# Patient Record
Sex: Female | Born: 1945 | Race: White | Hispanic: No | Marital: Single | Smoking: Never smoker
Health system: Southern US, Community
[De-identification: ages and names within clinical notes are randomized; demographics above are authoritative.]

## PROBLEM LIST (undated history)

## (undated) DIAGNOSIS — Z789 Other specified health status: Secondary | ICD-10-CM

## (undated) HISTORY — PX: HIP SURGERY: SHX245

---

## 2006-02-22 HISTORY — PX: INTRACAPSULAR CATARACT EXTRACTION: SHX361

## 2006-03-23 ENCOUNTER — Ambulatory Visit: Payer: Self-pay | Admitting: Ophthalmology

## 2014-05-27 DIAGNOSIS — H40003 Preglaucoma, unspecified, bilateral: Secondary | ICD-10-CM | POA: Diagnosis not present

## 2014-06-03 DIAGNOSIS — H40003 Preglaucoma, unspecified, bilateral: Secondary | ICD-10-CM | POA: Diagnosis not present

## 2014-12-02 DIAGNOSIS — H40003 Preglaucoma, unspecified, bilateral: Secondary | ICD-10-CM | POA: Diagnosis not present

## 2015-06-03 DIAGNOSIS — H40003 Preglaucoma, unspecified, bilateral: Secondary | ICD-10-CM | POA: Diagnosis not present

## 2015-12-02 DIAGNOSIS — H40003 Preglaucoma, unspecified, bilateral: Secondary | ICD-10-CM | POA: Diagnosis not present

## 2015-12-24 DIAGNOSIS — Z01419 Encounter for gynecological examination (general) (routine) without abnormal findings: Secondary | ICD-10-CM | POA: Diagnosis not present

## 2015-12-25 ENCOUNTER — Other Ambulatory Visit: Payer: Self-pay | Admitting: Obstetrics & Gynecology

## 2015-12-25 DIAGNOSIS — Z1231 Encounter for screening mammogram for malignant neoplasm of breast: Secondary | ICD-10-CM

## 2015-12-25 LAB — HM PAP SMEAR: HM PAP: NEGATIVE

## 2016-01-05 DIAGNOSIS — Z1211 Encounter for screening for malignant neoplasm of colon: Secondary | ICD-10-CM | POA: Diagnosis not present

## 2016-01-28 ENCOUNTER — Ambulatory Visit
Admission: RE | Admit: 2016-01-28 | Discharge: 2016-01-28 | Disposition: A | Discharge status: Home / Self Care | Payer: Medicare Other | Source: Ambulatory Visit | Attending: Obstetrics & Gynecology | Admitting: Obstetrics & Gynecology

## 2016-01-28 DIAGNOSIS — Z1231 Encounter for screening mammogram for malignant neoplasm of breast: Secondary | ICD-10-CM | POA: Diagnosis not present

## 2016-01-28 LAB — HM MAMMOGRAPHY

## 2016-02-05 ENCOUNTER — Inpatient Hospital Stay
Admission: RE | Admit: 2016-02-05 | Discharge: 2016-02-05 | Disposition: A | Discharge status: Home / Self Care | Payer: Self-pay | Source: Ambulatory Visit | Attending: *Deleted | Admitting: *Deleted

## 2016-02-05 ENCOUNTER — Other Ambulatory Visit: Payer: Self-pay | Admitting: *Deleted

## 2016-02-05 ENCOUNTER — Other Ambulatory Visit: Payer: Self-pay

## 2016-02-05 DIAGNOSIS — Z9289 Personal history of other medical treatment: Secondary | ICD-10-CM

## 2016-05-31 DIAGNOSIS — H40003 Preglaucoma, unspecified, bilateral: Secondary | ICD-10-CM | POA: Diagnosis not present

## 2016-12-07 DIAGNOSIS — H40053 Ocular hypertension, bilateral: Secondary | ICD-10-CM | POA: Diagnosis not present

## 2017-01-11 ENCOUNTER — Other Ambulatory Visit: Payer: Self-pay | Admitting: Obstetrics & Gynecology

## 2017-01-25 ENCOUNTER — Encounter: Payer: Self-pay | Admitting: Obstetrics & Gynecology

## 2017-01-25 ENCOUNTER — Ambulatory Visit (INDEPENDENT_AMBULATORY_CARE_PROVIDER_SITE_OTHER): Payer: Medicare Other | Admitting: Obstetrics & Gynecology

## 2017-01-25 VITALS — BP 130/80 | HR 88 | Ht 64.0 in | Wt 126.0 lb

## 2017-01-25 DIAGNOSIS — N959 Unspecified menopausal and perimenopausal disorder: Secondary | ICD-10-CM

## 2017-01-25 DIAGNOSIS — Z1231 Encounter for screening mammogram for malignant neoplasm of breast: Secondary | ICD-10-CM

## 2017-01-25 DIAGNOSIS — Z1211 Encounter for screening for malignant neoplasm of colon: Secondary | ICD-10-CM

## 2017-01-25 DIAGNOSIS — N952 Postmenopausal atrophic vaginitis: Secondary | ICD-10-CM

## 2017-01-25 DIAGNOSIS — Z1239 Encounter for other screening for malignant neoplasm of breast: Secondary | ICD-10-CM

## 2017-01-25 MED ORDER — CONJ ESTROG-MEDROXYPROGEST ACE 0.3-1.5 MG PO TABS: 1.0000 | ORAL_TABLET | Freq: Every day | ORAL | 11 refills | Status: DC

## 2017-01-25 MED ORDER — ACYCLOVIR 400 MG PO TABS: 400.0000 mg | ORAL_TABLET | Freq: Two times a day (BID) | ORAL | 11 refills | Status: DC

## 2017-01-25 MED ORDER — CONJ ESTROG-MEDROXYPROGEST ACE 0.3-1.5 MG PO TABS: 1.0000 | ORAL_TABLET | Freq: Every day | ORAL | 4 refills | Status: DC

## 2017-01-25 NOTE — Progress Notes (Signed)
HPI:      [name] is a 71 y.o. [phone] who is postmenopausal, presents today for a problem visit.  She complains of vaginal dryness.   Symptoms have been present for several years. Symptoms are mod to severe and has led her to come in today to seek options for intervention.  She has been on Prempro in past and this helps.  Wants to restart/cont this tx plan.  Does not desire vag ERT or alternative tx's. She is also taking acyclovir PRN for cold sore sx's. Denies PostMenopausal Bleeding.  No breast mass.  No hot flash.  Feels different mood and fatigue off of HRT.  PMHx: She  has no past medical history on file. Also,  has a past surgical history that includes Intracapsular cataract extraction., family history is not on file.,  reports that  has never smoked. she has never used smokeless tobacco. She reports that she drinks alcohol. She reports that she does not use drugs.  Current Outpatient Medications:  .  acyclovir (ZOVIRAX) 400 MG tablet, Take 1 tablet (400 mg total) by mouth 2 (two) times daily. Or as needed for symptoms., Disp: 60 tablet, Rfl: 11 .  estrogen, conjugated,-medroxyprogesterone (PREMPRO) 0.3-1.5 MG tablet, Take 1 tablet by mouth daily., Disp: 90 tablet, Rfl: 4 Also, has no allergies on file.  Review of Systems  Constitutional: Negative for chills, fever and malaise/fatigue.  HENT: Negative for congestion, sinus pain and sore throat.   Eyes: Negative for blurred vision and pain.  Respiratory: Negative for cough and wheezing.   Cardiovascular: Negative for chest pain and leg swelling.  Gastrointestinal: Negative for abdominal pain, constipation, diarrhea, heartburn, nausea and vomiting.  Genitourinary: Negative for dysuria, frequency, hematuria and urgency.  Musculoskeletal: Negative for back pain, joint pain, myalgias and neck pain.  Skin: Negative for itching and rash.  Neurological: Negative for dizziness, tremors and weakness.  Endo/Heme/Allergies: Does not  bruise/bleed easily.  Psychiatric/Behavioral: Negative for depression. The patient is not nervous/anxious and does not have insomnia.    Objective: BP 130/80   Pulse 88   Ht 5\' 4"  (1.626 m)   Wt 126 lb (57.2 kg)   BMI 21.63 kg/m  Physical Exam  Constitutional: She is oriented to person, place, and time. She appears well-developed and well-nourished. No distress.  Genitourinary: Rectum normal, vagina normal and uterus normal. Pelvic exam was performed with patient supine. There is no rash or lesion on the right labia. There is no rash or lesion on the left labia. Vagina exhibits no lesion. No bleeding in the vagina. Right adnexum does not display mass and does not display tenderness. Left adnexum does not display mass and does not display tenderness. Cervix does not exhibit motion tenderness, lesion, friability or polyp.   Uterus is mobile and midaxial. Uterus is not enlarged or exhibiting a mass.  HENT:  Head: Normocephalic and atraumatic. Head is without laceration.  Right Ear: Hearing normal.  Left Ear: Hearing normal.  Nose: No epistaxis.  No foreign bodies.  Mouth/Throat: Uvula is midline, oropharynx is clear and moist and mucous membranes are normal.  Eyes: Pupils are equal, round, and reactive to light.  Neck: Normal range of motion. Neck supple. No thyromegaly present.  Cardiovascular: Normal rate and regular rhythm. Exam reveals no gallop and no friction rub.  No murmur heard. Pulmonary/Chest: Effort normal and breath sounds normal. No respiratory distress. She has no wheezes. Right breast exhibits no mass, no skin change and no tenderness. Left breast exhibits no  mass, no skin change and no tenderness.  Abdominal: Soft. Bowel sounds are normal. She exhibits no distension. There is no tenderness. There is no rebound.  Musculoskeletal: Normal range of motion.  Neurological: She is alert and oriented to person, place, and time. No cranial nerve deficit.  Skin: Skin is warm and dry.   Psychiatric: She has a normal mood and affect. Judgment normal.  Vitals reviewed.   ASSESSMENT/PLAN:  Menopause. 1. Vaginal atrophy Prempro, pros and cons discussed; eRx. HRT I have discussed HRT with the patient in detail.  The risk/benefits of it were reviewed.  She understands that during menopause Estrogen decreases dramatically and that this results in an increased risk of cardiovascular disease as well as osteoporosis.  We have also discussed the fact that hot flashes often result from a decrease in Estrogen, and that by replacing Estrogen, they can often be alleviated.  We have discussed skin, vaginal and urinary tract changes that may also take place from this drop in Estrogen.  Emotional changes have also been linked to Estrogen and we have briefly discussed this.  The benefits of HRT including decrease in hot flashes, vaginal dryness, and osteoporosis were discussed.  The emotional benefit and a possible change in her cardiovascular risk profile was also reviewed.  The risks associated with Hormone Replacement Therapy were also reviewed.  The use of unopposed Estrogen and its relationship to endometrial cancer was discussed.  The addition of Progesterone and its beneficial effect on endometrial cancer was also noted.  The fact that there has been no consistent definitive studies showing an increase in breast cancer in women who use HRT was discussed with the patient.  The possible side effects including breast tenderness, fluid retention, mood changes and vaginal bleeding were discussed.  The patient was informed that this is an elective medication and that she may choose not to take Hormone Replacement Therapy.  Literature on HRT was given, and I believe that after answering all of the patient's questions, she has an adequate and informed understanding of HRT.  Special emphasis on the WHI study, as well as several studies since that pertaining to the risks and benefits of estrogen replacement  therapy were compared.  The possible limitations of these studies were discussed including the age stratification of the WHI study.  The possible role of Progesterone in these studies was discussed in detail.  I believe that the patient has an informed knowledge of the risks and benefits of HRT.  I have specifically discussed WHI findings and current updates.  Different type of hormone formulation and methods of taking hormone replacement therapy discussed.   2. Menopausal disorder As above  3. Screen for colon cancer - Fecal occult blood, imunochemical - Cologuard in past; due again 2020  4. Screening for breast cancer - MM DIGITAL SCREENING BILATERAL; Future  5. Annual every other year (next year) Pap last year normal  Barnett Applebaum, MD, [facility] 01/25/2017  9:46 AM

## 2017-01-25 NOTE — Patient Instructions (Signed)
PAP every three years Mammogram every year    Call [phone] to schedule at [facility] yearly (with PCP)   

## 2017-01-27 DIAGNOSIS — Z1211 Encounter for screening for malignant neoplasm of colon: Secondary | ICD-10-CM | POA: Diagnosis not present

## 2017-02-04 ENCOUNTER — Ambulatory Visit
Admission: RE | Admit: 2017-02-04 | Discharge: 2017-02-04 | Disposition: A | Discharge status: Home / Self Care | Payer: Medicare Other | Source: Ambulatory Visit | Attending: Obstetrics & Gynecology | Admitting: Obstetrics & Gynecology

## 2017-02-04 DIAGNOSIS — Z1231 Encounter for screening mammogram for malignant neoplasm of breast: Secondary | ICD-10-CM | POA: Insufficient documentation

## 2017-02-04 DIAGNOSIS — Z1239 Encounter for other screening for malignant neoplasm of breast: Secondary | ICD-10-CM

## 2017-02-04 LAB — FECAL OCCULT BLOOD, IMMUNOCHEMICAL: Fecal Occult Bld: NEGATIVE

## 2017-02-06 ENCOUNTER — Encounter: Payer: Self-pay | Admitting: Obstetrics & Gynecology

## 2017-06-06 DIAGNOSIS — H40053 Ocular hypertension, bilateral: Secondary | ICD-10-CM | POA: Diagnosis not present

## 2017-08-29 DIAGNOSIS — H2512 Age-related nuclear cataract, left eye: Secondary | ICD-10-CM | POA: Diagnosis not present

## 2017-08-30 NOTE — Discharge Instructions (Signed)

## 2017-08-31 ENCOUNTER — Encounter: Payer: Self-pay | Admitting: *Deleted

## 2017-08-31 ENCOUNTER — Other Ambulatory Visit: Payer: Self-pay

## 2017-09-07 ENCOUNTER — Ambulatory Visit: Payer: Medicare Other | Admitting: Anesthesiology

## 2017-09-07 ENCOUNTER — Encounter
Admission: RE | Disposition: A | Discharge status: Home / Self Care | Payer: Self-pay | Source: Ambulatory Visit | Attending: Ophthalmology

## 2017-09-07 ENCOUNTER — Ambulatory Visit
Admission: RE | Admit: 2017-09-07 | Discharge: 2017-09-07 | Disposition: A | Discharge status: Home / Self Care | Payer: Medicare Other | Source: Ambulatory Visit | Attending: Ophthalmology | Admitting: Ophthalmology

## 2017-09-07 DIAGNOSIS — H25812 Combined forms of age-related cataract, left eye: Secondary | ICD-10-CM | POA: Diagnosis not present

## 2017-09-07 DIAGNOSIS — Z881 Allergy status to other antibiotic agents status: Secondary | ICD-10-CM | POA: Diagnosis not present

## 2017-09-07 DIAGNOSIS — Z882 Allergy status to sulfonamides status: Secondary | ICD-10-CM | POA: Diagnosis not present

## 2017-09-07 DIAGNOSIS — Z79818 Long term (current) use of other agents affecting estrogen receptors and estrogen levels: Secondary | ICD-10-CM | POA: Diagnosis not present

## 2017-09-07 DIAGNOSIS — Z9849 Cataract extraction status, unspecified eye: Secondary | ICD-10-CM | POA: Diagnosis not present

## 2017-09-07 DIAGNOSIS — H2512 Age-related nuclear cataract, left eye: Secondary | ICD-10-CM | POA: Insufficient documentation

## 2017-09-07 DIAGNOSIS — Z888 Allergy status to other drugs, medicaments and biological substances status: Secondary | ICD-10-CM | POA: Insufficient documentation

## 2017-09-07 HISTORY — PX: CATARACT EXTRACTION W/PHACO: SHX586

## 2017-09-07 HISTORY — DX: Other specified health status: Z78.9

## 2017-09-07 SURGERY — PHACOEMULSIFICATION, CATARACT, WITH IOL INSERTION
Anesthesia: Monitor Anesthesia Care | Site: Eye | Laterality: Left | Wound class: "Clean "

## 2017-09-07 MED ORDER — ONDANSETRON HCL 4 MG/2ML IJ SOLN: 4.0000 mg | Freq: Once | INTRAMUSCULAR | Status: DC | PRN

## 2017-09-07 MED ORDER — MOXIFLOXACIN HCL 0.5 % OP SOLN
1.0000 [drp] | OPHTHALMIC | Status: DC | PRN
  Administered 2017-09-07 (×3): 1 [drp] via OPHTHALMIC

## 2017-09-07 MED ORDER — MOXIFLOXACIN HCL 0.5 % OP SOLN
OPHTHALMIC | Status: DC | PRN
  Administered 2017-09-07: 0.2 mL via OPHTHALMIC

## 2017-09-07 MED ORDER — LIDOCAINE HCL (PF) 2 % IJ SOLN
INTRAOCULAR | Status: DC | PRN
  Administered 2017-09-07: 1 mL

## 2017-09-07 MED ORDER — PHENYLEPHRINE HCL 10 % OP SOLN
1.0000 [drp] | OPHTHALMIC | Status: DC | PRN
  Administered 2017-09-07 (×3): 1 [drp] via OPHTHALMIC

## 2017-09-07 MED ORDER — CYCLOPENTOLATE HCL 2 % OP SOLN
1.0000 [drp] | OPHTHALMIC | Status: DC | PRN
  Administered 2017-09-07 (×3): 1 [drp] via OPHTHALMIC

## 2017-09-07 MED ORDER — MIDAZOLAM HCL 2 MG/2ML IJ SOLN
INTRAMUSCULAR | Status: DC | PRN
  Administered 2017-09-07 (×2): 1 mg via INTRAVENOUS

## 2017-09-07 MED ORDER — TETRACAINE HCL 0.5 % OP SOLN
1.0000 [drp] | OPHTHALMIC | Status: DC | PRN
  Administered 2017-09-07 (×2): 1 [drp] via OPHTHALMIC

## 2017-09-07 MED ORDER — EPINEPHRINE PF 1 MG/ML IJ SOLN
INTRAOCULAR | Status: DC | PRN
  Administered 2017-09-07: 70 mL via OPHTHALMIC

## 2017-09-07 MED ORDER — LACTATED RINGERS IV SOLN: 10.0000 mL/h | INTRAVENOUS | Status: DC

## 2017-09-07 MED ORDER — NA HYALUR & NA CHOND-NA HYALUR 0.4-0.35 ML IO KIT
PACK | INTRAOCULAR | Status: DC | PRN
  Administered 2017-09-07: 1 mL via INTRAOCULAR

## 2017-09-07 MED ORDER — OXYCODONE HCL 5 MG/5ML PO SOLN: 5.0000 mg | Freq: Once | ORAL | Status: DC | PRN

## 2017-09-07 MED ORDER — FENTANYL CITRATE (PF) 100 MCG/2ML IJ SOLN
INTRAMUSCULAR | Status: DC | PRN
  Administered 2017-09-07: 50 ug via INTRAVENOUS

## 2017-09-07 MED ORDER — OXYCODONE HCL 5 MG PO TABS: 5.0000 mg | ORAL_TABLET | Freq: Once | ORAL | Status: DC | PRN

## 2017-09-07 MED ORDER — BRIMONIDINE TARTRATE-TIMOLOL 0.2-0.5 % OP SOLN
OPHTHALMIC | Status: DC | PRN
  Administered 2017-09-07: 1 [drp] via OPHTHALMIC

## 2017-09-07 SURGICAL SUPPLY — 21 items
CANNULA ANT/CHMB 27G (MISCELLANEOUS) ×1 IMPLANT
CANNULA ANT/CHMB 27GA (MISCELLANEOUS) ×2 IMPLANT
GLOVE SURG LX 7.5 STRW (GLOVE) ×1
GLOVE SURG LX STRL 7.5 STRW (GLOVE) ×1 IMPLANT
GLOVE SURG TRIUMPH 8.0 PF LTX (GLOVE) ×2 IMPLANT
GOWN STRL REUS W/ TWL LRG LVL3 (GOWN DISPOSABLE) ×2 IMPLANT
GOWN STRL REUS W/TWL LRG LVL3 (GOWN DISPOSABLE) ×2
LENS IOL ACRSF IQ ULTRA 16.5 (Intraocular Lens) IMPLANT
LENS IOL ACRYSOF IQ 16.5 (Intraocular Lens) ×2 IMPLANT
MARKER SKIN DUAL TIP RULER LAB (MISCELLANEOUS) ×2 IMPLANT
NDL FILTER BLUNT 18X1 1/2 (NEEDLE) ×1 IMPLANT
NEEDLE FILTER BLUNT 18X 1/2SAF (NEEDLE) ×1
NEEDLE FILTER BLUNT 18X1 1/2 (NEEDLE) ×1 IMPLANT
PACK CATARACT BRASINGTON (MISCELLANEOUS) ×2 IMPLANT
PACK EYE AFTER SURG (MISCELLANEOUS) ×2 IMPLANT
PACK OPTHALMIC (MISCELLANEOUS) ×2 IMPLANT
SYR 3ML LL SCALE MARK (SYRINGE) ×2 IMPLANT
SYR 5ML LL (SYRINGE) ×2 IMPLANT
SYR TB 1ML LUER SLIP (SYRINGE) ×2 IMPLANT
WATER STERILE IRR 500ML POUR (IV SOLUTION) ×2 IMPLANT
WIPE NON LINTING 3.25X3.25 (MISCELLANEOUS) ×2 IMPLANT

## 2017-09-07 NOTE — Anesthesia Procedure Notes (Signed)
Procedure Name: MAC Date/Time: 09/07/2017 9:51 AM Performed by: Lind Guest, CRNA Pre-anesthesia Checklist: Patient identified, Emergency Drugs available, Suction available, Patient being monitored and Timeout performed Patient Re-evaluated:Patient Re-evaluated prior to induction Oxygen Delivery Method: Nasal cannula

## 2017-09-07 NOTE — Anesthesia Preprocedure Evaluation (Signed)
Anesthesia Evaluation  Patient identified by MRN, date of birth, ID band Patient awake    Reviewed: Allergy & Precautions, H&P , NPO status , Patient's Chart, lab work & pertinent test results  Airway Mallampati: II   Neck ROM: full    Dental no notable dental hx.    Pulmonary neg pulmonary ROS,    Pulmonary exam normal breath sounds clear to auscultation       Cardiovascular negative cardio ROS Normal cardiovascular exam Rhythm:regular Rate:Normal     Neuro/Psych    GI/Hepatic negative GI ROS, Neg liver ROS,   Endo/Other  negative endocrine ROS  Renal/GU negative Renal ROS     Musculoskeletal   Abdominal   Peds  Hematology negative hematology ROS (+)   Anesthesia Other Findings   Reproductive/Obstetrics negative OB ROS                            Anesthesia Physical Anesthesia Plan  ASA: II  Anesthesia Plan: MAC   Post-op Pain Management:    Induction:   PONV Risk Score and Plan:   Airway Management Planned:   Additional Equipment:   Intra-op Plan:   Post-operative Plan:   Informed Consent: I have reviewed the patients History and Physical, chart, labs and discussed the procedure including the risks, benefits and alternatives for the proposed anesthesia with the patient or authorized representative who has indicated his/her understanding and acceptance.     Plan Discussed with:   Anesthesia Plan Comments:         Anesthesia Quick Evaluation

## 2017-09-07 NOTE — H&P (Signed)
The History and Physical notes are on paper, have been signed, and are to be scanned. The patient remains stable and unchanged from the H&P.   Previous H&P reviewed, patient examined, and there are no changes.  Leah Gordon 09/07/2017 9:23 AM

## 2017-09-07 NOTE — Anesthesia Postprocedure Evaluation (Signed)
Anesthesia Post Note  Patient: Leah Gordon  Procedure(s) Performed: CATARACT EXTRACTION PHACO AND INTRAOCULAR LENS PLACEMENT ([facility]) LEFT (Left Eye)  Patient location during evaluation: PACU Anesthesia Type: MAC Level of consciousness: awake and alert Pain management: pain level controlled Vital Signs Assessment: post-procedure vital signs reviewed and stable Respiratory status: spontaneous breathing Cardiovascular status: blood pressure returned to baseline Postop Assessment: no headache Anesthetic complications: no    Jaci Standard, III,  Verdell Kincannon D

## 2017-09-07 NOTE — Transfer of Care (Signed)
Immediate Anesthesia Transfer of Care Note  Patient: Leah Gordon  Procedure(s) Performed: CATARACT EXTRACTION PHACO AND INTRAOCULAR LENS PLACEMENT ([facility]) LEFT (Left Eye)  Patient Location: PACU  Anesthesia Type: MAC  Level of Consciousness: awake, alert  and patient cooperative  Airway and Oxygen Therapy: Patient Spontanous Breathing and Patient connected to supplemental oxygen  Post-op Assessment: Post-op Vital signs reviewed, Patient's Cardiovascular Status Stable, Respiratory Function Stable, Patent Airway and No signs of Nausea or vomiting  Post-op Vital Signs: Reviewed and stable  Complications: No apparent anesthesia complications

## 2017-09-07 NOTE — Op Note (Signed)
OPERATIVE NOTE  Leah Gordon [id number] 09/07/2017   PREOPERATIVE DIAGNOSIS:  Nuclear sclerotic cataract left eye. H25.12   POSTOPERATIVE DIAGNOSIS:    Nuclear sclerotic cataract left eye.     PROCEDURE:  Phacoemusification with posterior chamber intraocular lens placement of the left eye   LENS:   Implant Name Type Inv. Item Serial No. Manufacturer Lot No. LRB No. Used  LENS IOL ACRYSOF IQ 16.5 - [id number] Intraocular Lens LENS IOL ACRYSOF IQ 16.5 [id number] [employer]  Left 1        ULTRASOUND TIME: 19  % of 1 minutes 19 seconds, CDE 15.4  SURGEON:  Wyonia Hough, MD   ANESTHESIA:  Topical with tetracaine drops and 2% Xylocaine jelly, augmented with 1% preservative-free intracameral lidocaine.    COMPLICATIONS:  None.   DESCRIPTION OF PROCEDURE:  The patient was identified in the holding room and transported to the operating room and placed in the supine position under the operating microscope.  The left eye was identified as the operative eye and it was prepped and draped in the usual sterile ophthalmic fashion.   A 1 millimeter clear-corneal paracentesis was made at the 1:30 position.  0.5 ml of preservative-free 1% lidocaine was injected into the anterior chamber.  The anterior chamber was filled with Viscoat viscoelastic.  A 2.4 millimeter keratome was used to make a near-clear corneal incision at the 10:30 position.  .  A curvilinear capsulorrhexis was made with a cystotome and capsulorrhexis forceps.  Balanced salt solution was used to hydrodissect and hydrodelineate the nucleus.   Phacoemulsification was then used in stop and chop fashion to remove the lens nucleus and epinucleus.  The remaining cortex was then removed using the irrigation and aspiration handpiece. Provisc was then placed into the capsular bag to distend it for lens placement.  A lens was then injected into the capsular bag.  The remaining viscoelastic was aspirated.   Wounds were hydrated with  balanced salt solution.  The anterior chamber was inflated to a physiologic pressure with balanced salt solution.  No wound leaks were noted. Vigamox 0.2 ml of a 1mg  per ml solution was injected into the anterior chamber for a dose of 0.2 mg of intracameral antibiotic at the completion of the case.   Timolol and Brimonidine drops were applied to the eye.  The patient was taken to the recovery room in stable condition without complications of anesthesia or surgery.  Leah Gordon 09/07/2017, 10:06 AM

## 2017-09-08 ENCOUNTER — Encounter: Payer: Self-pay | Admitting: Ophthalmology

## 2017-12-23 DIAGNOSIS — H40053 Ocular hypertension, bilateral: Secondary | ICD-10-CM | POA: Diagnosis not present

## 2018-03-02 ENCOUNTER — Ambulatory Visit (INDEPENDENT_AMBULATORY_CARE_PROVIDER_SITE_OTHER): Payer: Medicare Other | Admitting: Obstetrics & Gynecology

## 2018-03-02 ENCOUNTER — Encounter: Payer: Self-pay | Admitting: Obstetrics & Gynecology

## 2018-03-02 ENCOUNTER — Other Ambulatory Visit (HOSPITAL_COMMUNITY)
Admission: RE | Admit: 2018-03-02 | Discharge: 2018-03-02 | Disposition: A | Discharge status: Home / Self Care | Payer: Medicare Other | Source: Ambulatory Visit | Attending: Obstetrics & Gynecology | Admitting: Obstetrics & Gynecology

## 2018-03-02 VITALS — BP 120/80 | Ht 64.0 in | Wt 128.0 lb

## 2018-03-02 DIAGNOSIS — Z01419 Encounter for gynecological examination (general) (routine) without abnormal findings: Secondary | ICD-10-CM

## 2018-03-02 DIAGNOSIS — Z1239 Encounter for other screening for malignant neoplasm of breast: Secondary | ICD-10-CM

## 2018-03-02 DIAGNOSIS — Z124 Encounter for screening for malignant neoplasm of cervix: Secondary | ICD-10-CM

## 2018-03-02 DIAGNOSIS — Z1211 Encounter for screening for malignant neoplasm of colon: Secondary | ICD-10-CM

## 2018-03-02 DIAGNOSIS — Z1231 Encounter for screening mammogram for malignant neoplasm of breast: Secondary | ICD-10-CM

## 2018-03-02 MED ORDER — CONJ ESTROG-MEDROXYPROGEST ACE 0.3-1.5 MG PO TABS: 1.0000 | ORAL_TABLET | Freq: Every day | ORAL | 4 refills | Status: DC

## 2018-03-02 MED ORDER — ACYCLOVIR 400 MG PO TABS: 400.0000 mg | ORAL_TABLET | Freq: Two times a day (BID) | ORAL | 11 refills | Status: DC

## 2018-03-02 NOTE — Progress Notes (Signed)
HPI:      [name] is a 73 y.o. [phone] who LMP was in the past, she presents today for her annual examination.  The patient has no complaints today. The patient is not currently sexually active. Herlast pap: approximate date 2017 and was normal and last mammogram: approximate date 2018 and was normal.  The patient does perform self breast exams.  There is no notable family history of breast or ovarian cancer in her family. The patient is taking hormone replacement therapy. Patient denies post-menopausal vaginal bleeding.   The patient has regular exercise: yes. The patient denies current symptoms of depression.    GYN Hx: Last Colonoscopy:never ago. Does Cologuard (last one normal) Last DEXA: uncertain ago.    PMHx: Past Medical History:  Diagnosis Date  . Medical history non-contributory    Past Surgical History:  Procedure Laterality Date  . CATARACT EXTRACTION W/PHACO Left 09/07/2017   Procedure: CATARACT EXTRACTION PHACO AND INTRAOCULAR LENS PLACEMENT ([place]) LEFT;  Surgeon: Leandrew Koyanagi, MD;  Location: [facility];  Service: Ophthalmology;  Laterality: Left;  . INTRACAPSULAR CATARACT EXTRACTION  2008   History reviewed. No pertinent family history. Social History   Tobacco Use  . Smoking status: Never Smoker  . Smokeless tobacco: Never Used  Substance Use Topics  . Alcohol use: Yes    Alcohol/week: 4.0 standard drinks    Types: 4 Cans of beer per week    Frequency: Never  . Drug use: No    Current Outpatient Medications:  .  acyclovir (ZOVIRAX) 400 MG tablet, Take 1 tablet (400 mg total) by mouth 2 (two) times daily. Or as needed for symptoms., Disp: 60 tablet, Rfl: 11 .  estrogen, conjugated,-medroxyprogesterone (PREMPRO) 0.3-1.5 MG tablet, Take 1 tablet by mouth daily., Disp: 90 tablet, Rfl: 4 Allergies: Keflex [cephalexin]; Nitrofuran derivatives; and Sulfa antibiotics  Review of Systems  Constitutional: Negative for chills, fever and  malaise/fatigue.  HENT: Negative for congestion, sinus pain and sore throat.   Eyes: Negative for blurred vision and pain.  Respiratory: Negative for cough and wheezing.   Cardiovascular: Negative for chest pain and leg swelling.  Gastrointestinal: Negative for abdominal pain, constipation, diarrhea, heartburn, nausea and vomiting.  Genitourinary: Negative for dysuria, frequency, hematuria and urgency.  Musculoskeletal: Negative for back pain, joint pain, myalgias and neck pain.  Skin: Negative for itching and rash.  Neurological: Negative for dizziness, tremors and weakness.  Endo/Heme/Allergies: Does not bruise/bleed easily.  Psychiatric/Behavioral: Negative for depression. The patient is not nervous/anxious and does not have insomnia.     Objective: BP 120/80   Ht 5\' 4"  (1.626 m)   Wt 128 lb (58.1 kg)   BMI 21.97 kg/m   Filed Weights   03/02/18 0845  Weight: 128 lb (58.1 kg)   Body mass index is 21.97 kg/m. Physical Exam Constitutional:      General: She is not in acute distress.    Appearance: She is well-developed.  Genitourinary:     Pelvic exam was performed with patient supine.     Vagina, uterus and rectum normal.     No lesions in the vagina.     No vaginal bleeding.     No cervical motion tenderness, friability, lesion or polyp.     Uterus is mobile.     Uterus is not enlarged.     No uterine mass detected.    Uterus is midaxial.     No right or left adnexal mass present.     Right  adnexa not tender.     Left adnexa not tender.  HENT:     Head: Normocephalic and atraumatic. No laceration.     Right Ear: Hearing normal.     Left Ear: Hearing normal.     Mouth/Throat:     Pharynx: Uvula midline.  Eyes:     Pupils: Pupils are equal, round, and reactive to light.  Neck:     Musculoskeletal: Normal range of motion and neck supple.     Thyroid: No thyromegaly.  Cardiovascular:     Rate and Rhythm: Normal rate and regular rhythm.     Heart sounds: No murmur.  No friction rub. No gallop.   Pulmonary:     Effort: Pulmonary effort is normal. No respiratory distress.     Breath sounds: Normal breath sounds. No wheezing.  Chest:     Breasts:        Right: No mass, skin change or tenderness.        Left: No mass, skin change or tenderness.  Abdominal:     General: Bowel sounds are normal. There is no distension.     Palpations: Abdomen is soft.     Tenderness: There is no abdominal tenderness. There is no rebound.  Musculoskeletal: Normal range of motion.  Neurological:     Mental Status: She is alert and oriented to person, place, and time.     Cranial Nerves: No cranial nerve deficit.  Skin:    General: Skin is warm and dry.  Psychiatric:        Judgment: Judgment normal.  Vitals signs reviewed.     Assessment: Annual Exam 1. Women's annual routine gynecological examination   2. Screen for colon cancer   3. Screening for breast cancer   4. Screening for cervical cancer   5. Encounter for mammogram to establish baseline mammogram     Plan:            1.  Cervical Screening-  Pap smear done today, Pap smear schedule reviewed with patient, every two years as preferred by pt  2. Breast screening- Exam annually and mammogram scheduled  3. Cologuard vs colonoscopy discussed prefers Cologuard  4. Labs managed by PCP  5. Counseling for hormonal therapy: no change in therapy today, cont HRT per pt request  6. Acyclovir for oral herpes outbreaks     F/U  Return in about 1 year (around 03/03/2019) for Annual.  Barnett Applebaum, MD, [facility], [facility] 03/02/2018  9:45 AM

## 2018-03-02 NOTE — Patient Instructions (Signed)
PAP every two years Mammogram every year    Call [phone] to schedule at [facility] yearly (with PCP)

## 2018-03-03 LAB — CYTOLOGY - PAP: Diagnosis: NEGATIVE

## 2018-03-07 ENCOUNTER — Encounter: Payer: Self-pay | Admitting: Obstetrics & Gynecology

## 2018-03-07 DIAGNOSIS — Z1212 Encounter for screening for malignant neoplasm of rectum: Secondary | ICD-10-CM | POA: Diagnosis not present

## 2018-03-07 DIAGNOSIS — Z1211 Encounter for screening for malignant neoplasm of colon: Secondary | ICD-10-CM | POA: Diagnosis not present

## 2018-03-15 ENCOUNTER — Other Ambulatory Visit: Payer: Self-pay | Admitting: Obstetrics & Gynecology

## 2018-03-15 DIAGNOSIS — K639 Disease of intestine, unspecified: Secondary | ICD-10-CM

## 2018-03-15 LAB — COLOGUARD

## 2018-03-16 ENCOUNTER — Telehealth: Payer: Self-pay | Admitting: Gastroenterology

## 2018-03-16 ENCOUNTER — Other Ambulatory Visit: Payer: Self-pay

## 2018-03-16 ENCOUNTER — Telehealth: Payer: Self-pay

## 2018-03-16 DIAGNOSIS — K639 Disease of intestine, unspecified: Secondary | ICD-10-CM

## 2018-03-16 MED ORDER — NA SULFATE-K SULFATE-MG SULF 17.5-3.13-1.6 GM/177ML PO SOLN: 1.0000 | Freq: Once | ORAL | 0 refills | Status: AC

## 2018-03-16 NOTE — Telephone Encounter (Signed)
Please call patient to schedule a dx colonoscopy with having a positive [place] per patient. She is in the referral work-q. Would like to schedule with Dr Allen Norris

## 2018-03-16 NOTE — Telephone Encounter (Signed)
Pt is calling to schedule a colonoscopy please call before 3 o'clock due to work schedule

## 2018-03-16 NOTE — Telephone Encounter (Signed)
Patients call has been returned.  She has been scheduled for colonoscopy with Dr. Allen Norris on Monday 03/20/18.  Instructions have been reviewed during triage.  Instructions sent to her email at "[email or web address]".  Rx for Suprep faxed electronically to [facility]. Referral assigned to [place].  Thanks [employer]

## 2018-03-16 NOTE — Telephone Encounter (Signed)
LVM for pt to call office back to schedule her colonoscopy.  Thanks [employer]

## 2018-03-17 ENCOUNTER — Other Ambulatory Visit: Payer: Self-pay

## 2018-03-17 NOTE — Discharge Instructions (Signed)
General Anesthesia, Adult, Care After  This sheet gives you information about how to care for yourself after your procedure. Your health care provider may also give you more specific instructions. If you have problems or questions, contact your health care provider.  What can I expect after the procedure?  After the procedure, the following side effects are common:  Pain or discomfort at the IV site.  Nausea.  Vomiting.  Sore throat.  Trouble concentrating.  Feeling cold or chills.  Weak or tired.  Sleepiness and fatigue.  Soreness and body aches. These side effects can affect parts of the body that were not involved in surgery.  Follow these instructions at home:    For at least 24 hours after the procedure:  Have a responsible adult stay with you. It is important to have someone help care for you until you are awake and alert.  Rest as needed.  Do not:  Participate in activities in which you could fall or become injured.  Drive.  Use heavy machinery.  Drink alcohol.  Take sleeping pills or medicines that cause drowsiness.  Make important decisions or sign legal documents.  Take care of children on your own.  Eating and drinking  Follow any instructions from your health care provider about eating or drinking restrictions.  When you feel hungry, start by eating small amounts of foods that are soft and easy to digest (bland), such as toast. Gradually return to your regular diet.  Drink enough fluid to keep your urine pale yellow.  If you vomit, rehydrate by drinking water, juice, or clear broth.  General instructions  If you have sleep apnea, surgery and certain medicines can increase your risk for breathing problems. Follow instructions from your health care provider about wearing your sleep device:  Anytime you are sleeping, including during daytime naps.  While taking prescription pain medicines, sleeping medicines, or medicines that make you drowsy.  Return to your normal activities as told by your health care  provider. Ask your health care provider what activities are safe for you.  Take over-the-counter and prescription medicines only as told by your health care provider.  If you smoke, do not smoke without supervision.  Keep all follow-up visits as told by your health care provider. This is important.  Contact a health care provider if:  You have nausea or vomiting that does not get better with medicine.  You cannot eat or drink without vomiting.  You have pain that does not get better with medicine.  You are unable to pass urine.  You develop a skin rash.  You have a fever.  You have redness around your IV site that gets worse.  Get help right away if:  You have difficulty breathing.  You have chest pain.  You have blood in your urine or stool, or you vomit blood.  Summary  After the procedure, it is common to have a sore throat or nausea. It is also common to feel tired.  Have a responsible adult stay with you for the first 24 hours after general anesthesia. It is important to have someone help care for you until you are awake and alert.  When you feel hungry, start by eating small amounts of foods that are soft and easy to digest (bland), such as toast. Gradually return to your regular diet.  Drink enough fluid to keep your urine pale yellow.  Return to your normal activities as told by your health care provider. Ask your health care   provider what activities are safe for you.  This information is not intended to replace advice given to you by your health care provider. Make sure you discuss any questions you have with your health care provider.  Document Released: 05/17/2000 Document Revised: 09/24/2016 Document Reviewed: 09/24/2016  [employer]vier Interactive Patient Education  2019 [employer]

## 2018-03-20 ENCOUNTER — Ambulatory Visit: Payer: Medicare Other | Admitting: Anesthesiology

## 2018-03-20 ENCOUNTER — Encounter
Admission: RE | Disposition: A | Discharge status: Home / Self Care | Payer: Self-pay | Source: Home / Self Care | Attending: Gastroenterology

## 2018-03-20 ENCOUNTER — Ambulatory Visit
Admission: RE | Admit: 2018-03-20 | Discharge: 2018-03-20 | Disposition: A | Discharge status: Home / Self Care | Payer: Medicare Other | Attending: Gastroenterology | Admitting: Gastroenterology

## 2018-03-20 DIAGNOSIS — Z79890 Hormone replacement therapy (postmenopausal): Secondary | ICD-10-CM | POA: Insufficient documentation

## 2018-03-20 DIAGNOSIS — K635 Polyp of colon: Secondary | ICD-10-CM

## 2018-03-20 DIAGNOSIS — D120 Benign neoplasm of cecum: Secondary | ICD-10-CM

## 2018-03-20 DIAGNOSIS — K641 Second degree hemorrhoids: Secondary | ICD-10-CM | POA: Diagnosis not present

## 2018-03-20 DIAGNOSIS — K639 Disease of intestine, unspecified: Secondary | ICD-10-CM

## 2018-03-20 DIAGNOSIS — R195 Other fecal abnormalities: Secondary | ICD-10-CM | POA: Diagnosis not present

## 2018-03-20 DIAGNOSIS — D125 Benign neoplasm of sigmoid colon: Secondary | ICD-10-CM | POA: Diagnosis not present

## 2018-03-20 HISTORY — PX: COLONOSCOPY WITH PROPOFOL: SHX5780

## 2018-03-20 HISTORY — PX: POLYPECTOMY: SHX5525

## 2018-03-20 SURGERY — COLONOSCOPY WITH PROPOFOL
Anesthesia: General

## 2018-03-20 MED ORDER — PROPOFOL 10 MG/ML IV BOLUS
INTRAVENOUS | Status: DC | PRN
  Administered 2018-03-20: 100 mg via INTRAVENOUS

## 2018-03-20 MED ORDER — LIDOCAINE HCL (CARDIAC) PF 100 MG/5ML IV SOSY
PREFILLED_SYRINGE | INTRAVENOUS | Status: DC | PRN
  Administered 2018-03-20: 30 mg via INTRAVENOUS

## 2018-03-20 MED ORDER — SODIUM CHLORIDE 0.9 % IV SOLN: INTRAVENOUS | Status: DC

## 2018-03-20 MED ORDER — LACTATED RINGERS IV SOLN
INTRAVENOUS | Status: DC
  Administered 2018-03-20: 09:00:00 via INTRAVENOUS

## 2018-03-20 MED ORDER — STERILE WATER FOR IRRIGATION IR SOLN
Status: DC | PRN
  Administered 2018-03-20: 10:00:00

## 2018-03-20 SURGICAL SUPPLY — 24 items
CANISTER SUCT 1200ML W/VALVE (MISCELLANEOUS) ×3 IMPLANT
CLIP HMST 235XBRD CATH ROT (MISCELLANEOUS) IMPLANT
CLIP RESOLUTION 360 11X235 (MISCELLANEOUS)
ELECT REM PT RETURN 9FT ADLT (ELECTROSURGICAL)
ELECTRODE REM PT RTRN 9FT ADLT (ELECTROSURGICAL) IMPLANT
FCP ESCP3.2XJMB 240X2.8X (MISCELLANEOUS)
FORCEPS BIOP RAD 4 LRG CAP 4 (CUTTING FORCEPS) IMPLANT
FORCEPS BIOP RJ4 240 W/NDL (MISCELLANEOUS)
FORCEPS ESCP3.2XJMB 240X2.8X (MISCELLANEOUS) IMPLANT
GOWN CVR UNV OPN BCK APRN NK (MISCELLANEOUS) ×4 IMPLANT
GOWN ISOL THUMB LOOP REG UNIV (MISCELLANEOUS) ×2
INJECTOR VARIJECT VIN23 (MISCELLANEOUS) IMPLANT
KIT DEFENDO VALVE AND CONN (KITS) IMPLANT
KIT ENDO PROCEDURE OLY (KITS) ×3 IMPLANT
MARKER SPOT ENDO TATTOO 5ML (MISCELLANEOUS) IMPLANT
PROBE APC STR FIRE (PROBE) IMPLANT
RETRIEVER NET ROTH 2.5X230 LF (MISCELLANEOUS) IMPLANT
SNARE SHORT THROW 13M SML OVAL (MISCELLANEOUS) ×3 IMPLANT
SNARE SHORT THROW 30M LRG OVAL (MISCELLANEOUS) IMPLANT
SNARE SNG USE RND 15MM (INSTRUMENTS) IMPLANT
SPOT EX ENDOSCOPIC TATTOO (MISCELLANEOUS)
TRAP ETRAP POLY (MISCELLANEOUS) ×3 IMPLANT
VARIJECT INJECTOR VIN23 (MISCELLANEOUS)
WATER STERILE IRR 250ML POUR (IV SOLUTION) ×3 IMPLANT

## 2018-03-20 NOTE — Op Note (Signed)
[facility] Patient Name: Leah Gordon Procedure Date: 03/20/2018 9:26 AM MRN: [id number] Account #: [id number] Date of Birth: 1945-03-19 Admit Type: Outpatient Age: 73 Room: [facility] OR ROOM 01 Gender: Female Note Status: Finalized Procedure:            Colonoscopy Indications:          Positive Cologuard test Providers:            [name] MD, MD Referring MD:         Gae Dry, MD (Referring MD) Medicines:            Propofol per Anesthesia Complications:        No immediate complications. Procedure:            Pre-Anesthesia Assessment:                       - Prior to the procedure, a History and Physical was                        performed, and patient medications and allergies were                        reviewed. The patient's tolerance of previous                        anesthesia was also reviewed. The risks and benefits of                        the procedure and the sedation options and risks were                        discussed with the patient. All questions were                        answered, and informed consent was obtained. Prior                        Anticoagulants: The patient has taken no previous                        anticoagulant or antiplatelet agents. ASA Grade                        Assessment: II - A patient with mild systemic disease.                        After reviewing the risks and benefits, the patient was                        deemed in satisfactory condition to undergo the                        procedure.                       After obtaining informed consent, the colonoscope was                        passed under direct vision. Throughout the procedure,  the patient's blood pressure, pulse, and oxygen                        saturations were monitored continuously. The was                        introduced through the anus and advanced to the the   cecum, identified by appendiceal orifice and ileocecal                        valve. The colonoscopy was performed without                        difficulty. The patient tolerated the procedure well.                        The quality of the bowel preparation was excellent. Findings:      The perianal and digital rectal examinations were normal.      A 5 mm polyp was found in the cecum. The polyp was sessile. The polyp       was removed with a cold snare. Resection and retrieval were complete.      A 2 mm polyp was found in the sigmoid colon. The polyp was sessile. The       polyp was removed with a cold biopsy forceps. Resection and retrieval       were complete.      Non-bleeding internal hemorrhoids were found during retroflexion. The       hemorrhoids were Grade II (internal hemorrhoids that prolapse but reduce       spontaneously). Impression:           - One 5 mm polyp in the cecum, removed with a cold                        snare. Resected and retrieved.                       - One 2 mm polyp in the sigmoid colon, removed with a                        cold biopsy forceps. Resected and retrieved.                       - Non-bleeding internal hemorrhoids. Recommendation:       - Discharge patient to home.                       - Resume previous diet.                       - Continue present medications.                       - Await pathology results.                       - Repeat colonoscopy in 5 years if polyp adenoma and 10                        years if hyperplastic Procedure Code(s):    --- Professional ---  45385, Colonoscopy, flexible; with removal of tumor(s),                        polyp(s), or other lesion(s) by snare technique                       45380, 59, Colonoscopy, flexible; with biopsy, single                        or multiple Diagnosis Code(s):    --- Professional ---                       R19.5, Other fecal abnormalities                        D12.5, Benign neoplasm of sigmoid colon                       D12.0, Benign neoplasm of cecum CPT copyright 2018 American Medical Association. All rights reserved. The codes documented in this report are preliminary and upon coder review may  be revised to meet current compliance requirements. [name] MD, MD 03/20/2018 9:51:23 AM This report has been signed electronically. Number of Addenda: 0 Note Initiated On: 03/20/2018 9:26 AM Scope Withdrawal Time: 0 hours 6 minutes 52 seconds  Total Procedure Duration: 0 hours 11 minutes 25 seconds       [facility]

## 2018-03-20 NOTE — Anesthesia Postprocedure Evaluation (Signed)
Anesthesia Post Note  Patient: Leah Gordon  Procedure(s) Performed: COLONOSCOPY WITH PROPOFOL (N/A ) POLYPECTOMY  Patient location during evaluation: PACU Anesthesia Type: General Level of consciousness: awake and alert Pain management: pain level controlled Vital Signs Assessment: post-procedure vital signs reviewed and stable Respiratory status: spontaneous breathing, nonlabored ventilation, respiratory function stable and patient connected to nasal cannula oxygen Cardiovascular status: blood pressure returned to baseline and stable Postop Assessment: no apparent nausea or vomiting Anesthetic complications: no    Braiden Presutti

## 2018-03-20 NOTE — Transfer of Care (Addendum)
Immediate Anesthesia Transfer of Care Note  Patient: Leah Gordon  Procedure(s) Performed: COLONOSCOPY WITH PROPOFOL (N/A )  Patient Location: PACU  Anesthesia Type: General  Level of Consciousness: awake, alert  and patient cooperative  Airway and Oxygen Therapy: Patient Spontanous Breathing and Patient connected to supplemental oxygen  Post-op Assessment: Post-op Vital signs reviewed, Patient's Cardiovascular Status Stable, Respiratory Function Stable, Patent Airway and No signs of Nausea or vomiting  Post-op Vital Signs: Reviewed and stable  Complications: No apparent anesthesia complications

## 2018-03-20 NOTE — H&P (Signed)
[name], MD [address] [place], [place] [address] Phone:[phone] Fax : [phone]  Primary Care Physician:  Gae Dry, MD Primary Gastroenterologist:  Dr. Allen Norris  Pre-Procedure History & Physical: HPI:  Leah Gordon is a 73 y.o. female is here for an colonoscopy.   Past Medical History:  Diagnosis Date  . Medical history non-contributory     Past Surgical History:  Procedure Laterality Date  . CATARACT EXTRACTION W/PHACO Left 09/07/2017   Procedure: CATARACT EXTRACTION PHACO AND INTRAOCULAR LENS PLACEMENT ([place]) LEFT;  Surgeon: Leandrew Koyanagi, MD;  Location: [facility];  Service: Ophthalmology;  Laterality: Left;  . INTRACAPSULAR CATARACT EXTRACTION  2008    Prior to Admission medications   Medication Sig Start Date End Date Taking? Authorizing Provider  acyclovir (ZOVIRAX) 400 MG tablet Take 1 tablet (400 mg total) by mouth 2 (two) times daily. Or as needed for symptoms. 03/02/18  Yes Gae Dry, MD  estrogen, conjugated,-medroxyprogesterone (PREMPRO) 0.3-1.5 MG tablet Take 1 tablet by mouth daily. 03/02/18  Yes Gae Dry, MD    Allergies as of 03/16/2018 - Review Complete 03/02/2018  Allergen Reaction Noted  . Keflex [cephalexin] Hives 08/31/2017  . Nitrofuran derivatives Hives 08/31/2017  . Sulfa antibiotics Hives 08/31/2017    History reviewed. No pertinent family history.  Social History   Socioeconomic History  . Marital status: Single    Spouse name: Not on file  . Number of children: Not on file  . Years of education: Not on file  . Highest education level: Not on file  Occupational History  . Not on file  Social Needs  . Financial resource strain: Not on file  . Food insecurity:    Worry: Not on file    Inability: Not on file  . Transportation needs:    Medical: Not on file    Non-medical: Not on file  Tobacco Use  . Smoking status: Never Smoker  . Smokeless tobacco: Never Used  Substance  and Sexual Activity  . Alcohol use: Yes    Alcohol/week: 4.0 standard drinks    Types: 4 Cans of beer per week    Frequency: Never  . Drug use: No  . Sexual activity: Yes  Lifestyle  . Physical activity:    Days per week: Not on file    Minutes per session: Not on file  . Stress: Not on file  Relationships  . Social connections:    Talks on phone: Not on file    Gets together: Not on file    Attends religious service: Not on file    Active member of club or organization: Not on file    Attends meetings of clubs or organizations: Not on file    Relationship status: Not on file  . Intimate partner violence:    Fear of current or ex partner: Not on file    Emotionally abused: Not on file    Physically abused: Not on file    Forced sexual activity: Not on file  Other Topics Concern  . Not on file  Social History Narrative  . Not on file    Review of Systems: See HPI, otherwise negative ROS  Physical Exam: BP (!) 152/87   Pulse (!) 112   Temp 97.9 F (36.6 C) (Temporal)   Resp 16   Ht 5\' 4"  (1.626 m)   Wt 56.2 kg   SpO2 100%   BMI 21.28 kg/m  General:   Alert,  pleasant and  cooperative in NAD Head:  Normocephalic and atraumatic. Neck:  Supple; no masses or thyromegaly. Lungs:  Clear throughout to auscultation.    Heart:  Regular rate and rhythm. Abdomen:  Soft, nontender and nondistended. Normal bowel sounds, without guarding, and without rebound.   Neurologic:  Alert and  oriented x4;  grossly normal neurologically.  Impression/Plan: Leah Gordon is here for an colonoscopy to be performed for positive cologuard  Risks, benefits, limitations, and alternatives regarding  colonoscopy have been reviewed with the patient.  Questions have been answered.  All parties agreeable.   [name], MD  03/20/2018, 9:23 AM

## 2018-03-20 NOTE — Anesthesia Preprocedure Evaluation (Signed)
Anesthesia Evaluation  Patient identified by MRN, date of birth, ID band  Reviewed: NPO status   History of Anesthesia Complications Negative for: history of anesthetic complications  Airway Mallampati: II  TM Distance: >3 FB Neck ROM: full    Dental no notable dental hx.    Pulmonary neg pulmonary ROS,    Pulmonary exam normal        Cardiovascular Exercise Tolerance: Good negative cardio ROS Normal cardiovascular exam     Neuro/Psych negative neurological ROS  negative psych ROS   GI/Hepatic negative GI ROS, Neg liver ROS,   Endo/Other  negative endocrine ROS  Renal/GU negative Renal ROS  negative genitourinary   Musculoskeletal  (+) Arthritis  (back),   Abdominal   Peds  Hematology negative hematology ROS (+)   Anesthesia Other Findings   Reproductive/Obstetrics                             Anesthesia Physical Anesthesia Plan  ASA: II  Anesthesia Plan: General   Post-op Pain Management:    Induction:   PONV Risk Score and Plan:   Airway Management Planned: Natural Airway  Additional Equipment:   Intra-op Plan:   Post-operative Plan:   Informed Consent: I have reviewed the patients History and Physical, chart, labs and discussed the procedure including the risks, benefits and alternatives for the proposed anesthesia with the patient or authorized representative who has indicated his/her understanding and acceptance.       Plan Discussed with: CRNA  Anesthesia Plan Comments:         Anesthesia Quick Evaluation

## 2018-03-20 NOTE — Anesthesia Procedure Notes (Signed)
Procedure Name: General with mask airway Performed by: Izetta Dakin, CRNA Pre-anesthesia Checklist: Patient identified, Emergency Drugs available, Suction available, Patient being monitored and Timeout performed Patient Re-evaluated:Patient Re-evaluated prior to induction Oxygen Delivery Method: Non-rebreather mask

## 2018-03-21 ENCOUNTER — Encounter: Payer: Self-pay | Admitting: Gastroenterology

## 2018-03-28 ENCOUNTER — Encounter: Payer: Self-pay | Admitting: Gastroenterology

## 2018-03-28 ENCOUNTER — Telehealth: Payer: Self-pay | Admitting: Gastroenterology

## 2018-03-28 NOTE — Telephone Encounter (Signed)
Pt advised Dr. Allen Norris has her pathology results and I will contact her as soon as he reviews.

## 2018-03-28 NOTE — Telephone Encounter (Signed)
Pt is calling to receive her Biopsy results from her procedure please call  cb [phone]

## 2018-03-29 ENCOUNTER — Ambulatory Visit
Admission: RE | Admit: 2018-03-29 | Discharge: 2018-03-29 | Disposition: A | Discharge status: Home / Self Care | Payer: Medicare Other | Source: Ambulatory Visit | Attending: Obstetrics & Gynecology | Admitting: Obstetrics & Gynecology

## 2018-03-29 DIAGNOSIS — Z1231 Encounter for screening mammogram for malignant neoplasm of breast: Secondary | ICD-10-CM | POA: Diagnosis not present

## 2018-03-29 NOTE — Telephone Encounter (Signed)
Pt notified of colonoscopy pathology results.

## 2018-03-29 NOTE — Telephone Encounter (Signed)
This was already done yesterday 

## 2018-10-02 DIAGNOSIS — H40003 Preglaucoma, unspecified, bilateral: Secondary | ICD-10-CM | POA: Diagnosis not present

## 2018-10-09 DIAGNOSIS — H40003 Preglaucoma, unspecified, bilateral: Secondary | ICD-10-CM | POA: Diagnosis not present

## 2018-12-13 DIAGNOSIS — Z23 Encounter for immunization: Secondary | ICD-10-CM | POA: Diagnosis not present

## 2019-04-10 DIAGNOSIS — H40053 Ocular hypertension, bilateral: Secondary | ICD-10-CM | POA: Diagnosis not present

## 2019-05-08 ENCOUNTER — Telehealth: Payer: Self-pay | Admitting: Obstetrics & Gynecology

## 2019-05-08 NOTE — Telephone Encounter (Signed)
Sch for Virtual follow up appt

## 2019-05-08 NOTE — Telephone Encounter (Signed)
Patient is calling needing to schedule her yearly medication follow up. Patient is wanting to know if she can do over the phone visit for this appointment since she doesn't need an exam. Patient is aware medicare doesn't cover for annual exams yearly. Please advise

## 2019-05-08 NOTE — Telephone Encounter (Signed)
Called and left voice mail for patient to call back to be schedule °

## 2019-05-22 ENCOUNTER — Telehealth (INDEPENDENT_AMBULATORY_CARE_PROVIDER_SITE_OTHER): Payer: Medicare Other | Admitting: Obstetrics & Gynecology

## 2019-05-22 ENCOUNTER — Other Ambulatory Visit: Payer: Self-pay

## 2019-05-22 ENCOUNTER — Encounter: Payer: Self-pay | Admitting: Obstetrics & Gynecology

## 2019-05-22 DIAGNOSIS — N952 Postmenopausal atrophic vaginitis: Secondary | ICD-10-CM

## 2019-05-22 DIAGNOSIS — Z8619 Personal history of other infectious and parasitic diseases: Secondary | ICD-10-CM | POA: Diagnosis not present

## 2019-05-22 MED ORDER — PREMPRO 0.3-1.5 MG PO TABS: 1.0000 | ORAL_TABLET | Freq: Every day | ORAL | 4 refills | Status: DC

## 2019-05-22 MED ORDER — ACYCLOVIR 400 MG PO TABS: 400.0000 mg | ORAL_TABLET | Freq: Two times a day (BID) | ORAL | 11 refills | Status: DC

## 2019-05-22 NOTE — Progress Notes (Signed)
Virtual Visit via Video Note  I connected with Leah Gordon on 05/22/19 at  4:10 PM EDT by a video enabled telemedicine application and verified that I am speaking with the correct person using two identifiers.  Location: Patient: Home Provider: Office   I discussed the limitations of evaluation and management by telemedicine and the availability of in person appointments. The patient expressed understanding and agreed to proceed.  History of Present Illness: Leah Gordon is a 74 y.o. who was started on HRT and Acyclovir approximately many months ago. Since that time, she states that her symptoms are improving. If she misses too many doses of Prempro, she has worsening vaginal dryness and discomfort.  Also develops cold sores when in cold or windy temps, acyclovir always helps.  PMHx: She  has a past medical history of Medical history non-contributory. Also,  has a past surgical history that includes Intracapsular cataract extraction (2008); Cataract extraction w/PHACO (Left, 09/07/2017); Colonoscopy with propofol (N/A, 03/20/2018); and polypectomy (03/20/2018)., family history is not on file.,  reports that she has never smoked. She has never used smokeless tobacco. She reports current alcohol use of about 4.0 standard drinks of alcohol per week. She reports that she does not use drugs.  Current Outpatient Medications  Medication Instructions  . acyclovir (ZOVIRAX) 400 mg, Oral, 2 times daily, Or as needed for symptoms.  Marland Kitchen estrogen, conjugated,-medroxyprogesterone (PREMPRO) 0.3-1.5 MG tablet 1 tablet, Oral, Daily   Also, is allergic to keflex [cephalexin]; nitrofuran derivatives; and sulfa antibiotics..  Review of Systems  All other systems reviewed and are negative.   Observations/Objective: No exam today, due to telephone eVisit due to [facility] virus restriction on elective visits and procedures.  Prior visits reviewed along with ultrasounds/labs as indicated.  Assessment and Plan:  ICD-10-CM   1. Vaginal atrophy  N95.2 estrogen, conjugated,-medroxyprogesterone (PREMPRO) 0.3-1.5 MG tablet  2. H/O cold sores  Z86.19 acyclovir (ZOVIRAX) 400 MG tablet  eRx refilled after discussion of pros and cons and side effects of HRT for vag atrophy.  She takes low dose HRT 5 days weekly without problems in past, desires to continue.  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 The patient was provided an opportunity to ask questions and all were answered. The patient agreed with the plan and demonstrated an understanding of the instructions.   The patient was advised to call back or seek an in-person evaluation if the symptoms worsen or if the condition fails to improve as anticipated.  A total of 20 minutes were spent face-to-face with the patient as well as preparation, review, communication, and documentation during this encounter.   Barnett Applebaum, MD, [facility], [facility] 05/22/2019  4:13 PM

## 2019-06-14 ENCOUNTER — Other Ambulatory Visit: Payer: Self-pay | Admitting: Obstetrics & Gynecology

## 2019-07-27 DIAGNOSIS — M25551 Pain in right hip: Secondary | ICD-10-CM | POA: Diagnosis not present

## 2019-07-27 DIAGNOSIS — M1611 Unilateral primary osteoarthritis, right hip: Secondary | ICD-10-CM | POA: Diagnosis not present

## 2019-08-23 HISTORY — PX: HIP SURGERY: SHX245

## 2019-09-06 DIAGNOSIS — Z01818 Encounter for other preprocedural examination: Secondary | ICD-10-CM | POA: Diagnosis not present

## 2019-09-06 DIAGNOSIS — Z79890 Hormone replacement therapy (postmenopausal): Secondary | ICD-10-CM | POA: Diagnosis not present

## 2019-09-06 DIAGNOSIS — R030 Elevated blood-pressure reading, without diagnosis of hypertension: Secondary | ICD-10-CM | POA: Diagnosis not present

## 2019-09-06 DIAGNOSIS — M1611 Unilateral primary osteoarthritis, right hip: Secondary | ICD-10-CM | POA: Diagnosis not present

## 2019-09-06 DIAGNOSIS — N952 Postmenopausal atrophic vaginitis: Secondary | ICD-10-CM | POA: Diagnosis not present

## 2019-09-17 DIAGNOSIS — Z20822 Contact with and (suspected) exposure to covid-19: Secondary | ICD-10-CM | POA: Diagnosis not present

## 2019-09-19 DIAGNOSIS — R030 Elevated blood-pressure reading, without diagnosis of hypertension: Secondary | ICD-10-CM | POA: Diagnosis not present

## 2019-09-19 DIAGNOSIS — R002 Palpitations: Secondary | ICD-10-CM | POA: Diagnosis not present

## 2019-09-19 DIAGNOSIS — Z79890 Hormone replacement therapy (postmenopausal): Secondary | ICD-10-CM | POA: Diagnosis not present

## 2019-09-19 DIAGNOSIS — Z96641 Presence of right artificial hip joint: Secondary | ICD-10-CM | POA: Diagnosis not present

## 2019-09-19 DIAGNOSIS — R42 Dizziness and giddiness: Secondary | ICD-10-CM | POA: Diagnosis not present

## 2019-09-19 DIAGNOSIS — Z79899 Other long term (current) drug therapy: Secondary | ICD-10-CM | POA: Diagnosis not present

## 2019-09-19 DIAGNOSIS — Z791 Long term (current) use of non-steroidal anti-inflammatories (NSAID): Secondary | ICD-10-CM | POA: Diagnosis not present

## 2019-09-19 DIAGNOSIS — Z471 Aftercare following joint replacement surgery: Secondary | ICD-10-CM | POA: Diagnosis not present

## 2019-09-19 DIAGNOSIS — R1013 Epigastric pain: Secondary | ICD-10-CM | POA: Diagnosis not present

## 2019-09-19 DIAGNOSIS — N952 Postmenopausal atrophic vaginitis: Secondary | ICD-10-CM | POA: Diagnosis not present

## 2019-09-19 DIAGNOSIS — M1611 Unilateral primary osteoarthritis, right hip: Secondary | ICD-10-CM | POA: Diagnosis not present

## 2019-09-20 DIAGNOSIS — R002 Palpitations: Secondary | ICD-10-CM | POA: Diagnosis not present

## 2019-09-20 DIAGNOSIS — R42 Dizziness and giddiness: Secondary | ICD-10-CM | POA: Diagnosis not present

## 2019-09-20 DIAGNOSIS — R030 Elevated blood-pressure reading, without diagnosis of hypertension: Secondary | ICD-10-CM | POA: Diagnosis not present

## 2019-09-20 DIAGNOSIS — M1611 Unilateral primary osteoarthritis, right hip: Secondary | ICD-10-CM | POA: Diagnosis not present

## 2019-09-20 DIAGNOSIS — R1013 Epigastric pain: Secondary | ICD-10-CM | POA: Diagnosis not present

## 2019-09-20 DIAGNOSIS — N952 Postmenopausal atrophic vaginitis: Secondary | ICD-10-CM | POA: Diagnosis not present

## 2019-09-20 DIAGNOSIS — Z791 Long term (current) use of non-steroidal anti-inflammatories (NSAID): Secondary | ICD-10-CM | POA: Diagnosis not present

## 2019-10-16 DIAGNOSIS — R262 Difficulty in walking, not elsewhere classified: Secondary | ICD-10-CM | POA: Diagnosis not present

## 2019-10-23 DIAGNOSIS — R262 Difficulty in walking, not elsewhere classified: Secondary | ICD-10-CM | POA: Diagnosis not present

## 2019-11-05 DIAGNOSIS — Z96641 Presence of right artificial hip joint: Secondary | ICD-10-CM | POA: Diagnosis not present

## 2019-11-05 DIAGNOSIS — Z471 Aftercare following joint replacement surgery: Secondary | ICD-10-CM | POA: Diagnosis not present

## 2019-11-06 DIAGNOSIS — R262 Difficulty in walking, not elsewhere classified: Secondary | ICD-10-CM | POA: Diagnosis not present

## 2019-11-20 DIAGNOSIS — R262 Difficulty in walking, not elsewhere classified: Secondary | ICD-10-CM | POA: Diagnosis not present

## 2019-12-07 DIAGNOSIS — H40003 Preglaucoma, unspecified, bilateral: Secondary | ICD-10-CM | POA: Diagnosis not present

## 2019-12-19 DIAGNOSIS — Z23 Encounter for immunization: Secondary | ICD-10-CM | POA: Diagnosis not present

## 2020-05-09 ENCOUNTER — Other Ambulatory Visit: Payer: Self-pay

## 2020-05-09 ENCOUNTER — Ambulatory Visit (INDEPENDENT_AMBULATORY_CARE_PROVIDER_SITE_OTHER): Payer: Medicare Other | Admitting: Obstetrics & Gynecology

## 2020-05-09 ENCOUNTER — Encounter: Payer: Self-pay | Admitting: Obstetrics & Gynecology

## 2020-05-09 VITALS — BP 130/80 | Ht 64.0 in | Wt 132.0 lb

## 2020-05-09 DIAGNOSIS — Z1231 Encounter for screening mammogram for malignant neoplasm of breast: Secondary | ICD-10-CM | POA: Diagnosis not present

## 2020-05-09 DIAGNOSIS — Z01419 Encounter for gynecological examination (general) (routine) without abnormal findings: Secondary | ICD-10-CM

## 2020-05-09 DIAGNOSIS — Z8619 Personal history of other infectious and parasitic diseases: Secondary | ICD-10-CM | POA: Diagnosis not present

## 2020-05-09 DIAGNOSIS — N952 Postmenopausal atrophic vaginitis: Secondary | ICD-10-CM | POA: Diagnosis not present

## 2020-05-09 MED ORDER — PREMPRO 0.3-1.5 MG PO TABS: 1.0000 | ORAL_TABLET | Freq: Every day | ORAL | 4 refills | Status: DC

## 2020-05-09 MED ORDER — ACYCLOVIR 400 MG PO TABS: 400.0000 mg | ORAL_TABLET | Freq: Two times a day (BID) | ORAL | 11 refills | Status: DC

## 2020-05-09 NOTE — Progress Notes (Signed)
HPI:      Ms. Leah Leah Gordon is a 75 y.o. G3P3003 who LMP was in the past, she presents today for her annual examination.  The patient has no complaints today; she went off HRT last year for 3 mos due to Hip Surgery and did not do well w vag dryness and pain, then resumed HRT (Prempro) and has done quite well. The patient is sexually active. Herlast pap: approximate date 2020 and was normal and last mammogram: approximate date 2020 and was normal.  The patient does perform self breast exams.  There is no notable family history of breast or ovarian cancer in her family. The patient is taking hormone replacement therapy. Patient denies post-menopausal vaginal bleeding.   The patient has regular exercise: yes. The patient denies current symptoms of depression.    GYN Hx: Last Colonoscopy:2 years ago. Normal.   PMHx: Past Medical History:  Diagnosis Date  . Medical history non-contributory    Past Surgical History:  Procedure Laterality Date  . CATARACT EXTRACTION W/PHACO Left 09/07/2017   Procedure: CATARACT EXTRACTION PHACO AND INTRAOCULAR LENS PLACEMENT (Leah Gordon) LEFT;  Surgeon: Leandrew Koyanagi, MD;  Location: [facility];  Service: Ophthalmology;  Laterality: Left;  . COLONOSCOPY WITH PROPOFOL N/A 03/20/2018   Procedure: COLONOSCOPY WITH PROPOFOL;  Surgeon: [name], MD;  Location: [facility];  Service: Endoscopy;  Laterality: N/A;  . HIP SURGERY    . INTRACAPSULAR CATARACT EXTRACTION  2008  . POLYPECTOMY  03/20/2018   Procedure: POLYPECTOMY;  Surgeon: [name], MD;  Location: [facility];  Service: Endoscopy;;   History reviewed. No pertinent family history. Social History   Tobacco Use  . Smoking status: Never Smoker  . Smokeless tobacco: Never Used  Vaping Use  . Vaping Use: Never used  Substance Use Topics  . Alcohol use: Yes    Alcohol/week: 4.0 standard drinks    Types: 4 Cans of beer per week  . Drug use: No    Current Outpatient  Medications:  .  acyclovir (ZOVIRAX) 400 MG tablet, Take 1 tablet (400 mg total) by mouth 2 (two) times daily. Or as needed for symptoms., Disp: 60 tablet, Rfl: 11 .  estrogen, conjugated,-medroxyprogesterone (PREMPRO) 0.3-1.5 MG tablet, Take 1 tablet by mouth daily., Disp: 90 tablet, Rfl: 4 Allergies: Keflex [cephalexin], Nitrofuran derivatives, and Sulfa antibiotics  Review of Systems  Constitutional: Negative for chills, fever and malaise/fatigue.  HENT: Negative for congestion, sinus pain and sore throat.   Eyes: Negative for blurred vision and pain.  Respiratory: Negative for cough and wheezing.   Cardiovascular: Negative for chest pain and leg swelling.  Gastrointestinal: Negative for abdominal pain, constipation, diarrhea, heartburn, nausea and vomiting.  Genitourinary: Negative for dysuria, frequency, hematuria and urgency.  Musculoskeletal: Negative for back pain, joint pain, myalgias and neck pain.  Skin: Negative for itching and rash.  Neurological: Negative for dizziness, tremors and weakness.  Endo/Heme/Allergies: Does not bruise/bleed easily.  Psychiatric/Behavioral: Negative for depression. The patient is not nervous/anxious and does not have insomnia.     Objective: BP 130/80   Ht 5\' 4"  (1.626 m)   Wt 132 lb (59.9 kg)   BMI 22.66 kg/m   Filed Weights   05/09/20 0911  Weight: 132 lb (59.9 kg)   Body mass index is 22.66 kg/m. Physical Exam Constitutional:      General: She is not in acute distress.    Appearance: She is well-developed.  Genitourinary:     Bladder, rectum and urethral meatus normal.  No lesions in the vagina.     Right Labia: No rash or tenderness.    Left Labia: No tenderness or rash.    No vaginal bleeding.      Right Adnexa: not tender and no mass present.    Left Adnexa: not tender and no mass present.    No cervical motion tenderness, friability, lesion or polyp.     Uterus is not enlarged.     No uterine mass detected.    Uterus  exam comments: Min prolapse.     Uterus is midaxial.     Bladder exam comments: Min cystocele.     Pelvic exam was performed with patient in the lithotomy position.  Breasts:     Right: No mass, skin change or tenderness.     Left: No mass, skin change or tenderness.    HENT:     Head: Normocephalic and atraumatic. No laceration.     Right Ear: Hearing normal.     Left Ear: Hearing normal.     Mouth/Throat:     Pharynx: Uvula midline.  Eyes:     Pupils: Pupils are equal, round, and reactive to light.  Neck:     Thyroid: No thyromegaly.  Cardiovascular:     Rate and Rhythm: Normal rate and regular rhythm.     Heart sounds: No murmur heard. No friction rub. No gallop.   Pulmonary:     Effort: Pulmonary effort is normal. No respiratory distress.     Breath sounds: Normal breath sounds. No wheezing.  Abdominal:     General: Bowel sounds are normal. There is no distension.     Palpations: Abdomen is soft.     Tenderness: There is no abdominal tenderness. There is no rebound.  Musculoskeletal:        General: Normal range of motion.     Cervical back: Normal range of motion and neck supple.  Neurological:     Mental Status: She is alert and oriented to person, place, and time.     Cranial Nerves: No cranial nerve deficit.  Skin:    General: Skin is warm and dry.  Psychiatric:        Judgment: Judgment normal.  Vitals reviewed.     Assessment: Annual Exam 1. Women's annual routine gynecological examination   2. Vaginal atrophy   3. H/O cold sores   4. Encounter for screening mammogram for malignant neoplasm of breast     Plan:            1.  Cervical Screening-  Pap smear schedule reviewed with patient, pt desires to discontinue PAP screening for the future  2. Breast screening- Exam annually and mammogram scheduled  3. Colonoscopy every 10 years, Hemoccult testing after age 57  4. Labs managed by PCP  5. Counseling for hormonal therapy: no change in therapy  today Pros and cons, she benefits for vag sx's and OP prevention  6. Oral fever blisters, cont prn use of acyclovir              F/U  Return in about 1 year (around 05/09/2021) for Virtual follow up visit.  Barnett Applebaum, MD, [facility], [facility] 05/09/2020  10:00 AM

## 2020-05-09 NOTE — Patient Instructions (Signed)
Mammogram every year    Call [phone] to schedule at [facility] Colonoscopy every 10 years Labs yearly (with PCP)  Thank you for choosing [facility]. As part of our ongoing efforts to improve patient experience, we would appreciate your feedback. Please fill out the short survey that you will receive by mail or MyChart. Your opinion is important to [place]! - Dr. Kenton Kingfisher

## 2020-06-06 ENCOUNTER — Ambulatory Visit
Admission: RE | Admit: 2020-06-06 | Discharge: 2020-06-06 | Disposition: A | Discharge status: Home / Self Care | Payer: Medicare Other | Source: Ambulatory Visit | Attending: Obstetrics & Gynecology | Admitting: Obstetrics & Gynecology

## 2020-06-06 ENCOUNTER — Other Ambulatory Visit: Payer: Self-pay

## 2020-06-06 DIAGNOSIS — Z1231 Encounter for screening mammogram for malignant neoplasm of breast: Secondary | ICD-10-CM | POA: Diagnosis not present

## 2020-06-16 DIAGNOSIS — H40053 Ocular hypertension, bilateral: Secondary | ICD-10-CM | POA: Diagnosis not present

## 2020-09-18 DIAGNOSIS — Z96641 Presence of right artificial hip joint: Secondary | ICD-10-CM | POA: Diagnosis not present

## 2020-09-18 DIAGNOSIS — Z471 Aftercare following joint replacement surgery: Secondary | ICD-10-CM | POA: Diagnosis not present

## 2020-10-21 DIAGNOSIS — M5442 Lumbago with sciatica, left side: Secondary | ICD-10-CM | POA: Diagnosis not present

## 2020-10-21 DIAGNOSIS — G8929 Other chronic pain: Secondary | ICD-10-CM | POA: Diagnosis not present

## 2020-10-29 DIAGNOSIS — Z23 Encounter for immunization: Secondary | ICD-10-CM | POA: Diagnosis not present

## 2020-10-30 ENCOUNTER — Ambulatory Visit
Admission: RE | Admit: 2020-10-30 | Discharge: 2020-10-30 | Disposition: A | Discharge status: Home / Self Care | Payer: Medicare Other | Source: Ambulatory Visit | Attending: Physician Assistant | Admitting: Physician Assistant

## 2020-10-30 ENCOUNTER — Other Ambulatory Visit: Payer: Self-pay

## 2020-10-30 VITALS — BP 159/112 | HR 100 | Temp 98.3°F | Resp 18 | Ht 64.0 in | Wt 132.1 lb

## 2020-10-30 DIAGNOSIS — S91032A Puncture wound without foreign body, left ankle, initial encounter: Secondary | ICD-10-CM | POA: Diagnosis not present

## 2020-10-30 DIAGNOSIS — Z23 Encounter for immunization: Secondary | ICD-10-CM | POA: Diagnosis not present

## 2020-10-30 DIAGNOSIS — Z2839 Other underimmunization status: Secondary | ICD-10-CM

## 2020-10-30 MED ORDER — TETANUS-DIPHTH-ACELL PERTUSSIS 5-2.5-18.5 LF-MCG/0.5 IM SUSY
0.5000 mL | PREFILLED_SYRINGE | Freq: Once | INTRAMUSCULAR | Status: AC
  Administered 2020-10-30: 0.5 mL via INTRAMUSCULAR

## 2020-10-30 NOTE — Discharge Instructions (Signed)
-  The puncture site is well healing and not infected. Continue to keep clean and follow up with [place] if any signs of infection--redness, swelling, pain, pustular drainage -You have had your tetanus updated today. Should be good for 10 years. You may feel some soreness. Continue with Tylenol and icing the arm.

## 2020-10-30 NOTE — ED Triage Notes (Signed)
Pt states that about 5 days ago she hit her left lower leg with a rusty tomato gate. She states she has not had a tetnus in years. She just had her covid and flu vaccines yesterday. She is wanting to know if she needs a Td. She states the area is about healed up and does not hurt. Area is not red, or swollen.

## 2020-10-30 NOTE — ED Provider Notes (Signed)
[facility]    CSN: [id number] Arrival date & time: 10/30/20  1025      History   Chief Complaint Chief Complaint  Patient presents with   Leg Injury    Left lower leg    HPI Leah Gordon is a 75 y.o. female presenting for a small puncture wound to the left posterior ankle.  Patient says that a rusty metal piece of the tomato grate punctured her leg about 4 days ago.  She says she has kept the area clean and it does not hurt.  It is not bleeding and the area has closed up.  She says it is healing well but she became concerned that she might need a tetanus immunization since it has been approximately 15 years since she has had tetanus immunization.  Patient does report that she had both COVID-19 booster injection and flu vaccine yesterday and was unsure if she can get the tetanus injection today.  She has mild tenderness of the left arm where the COVID-vaccine was given but has been taking Tylenol.  Overall she says she feels well and denies any fatigue, aches or weakness.  No other complaints.  HPI  Past Medical History:  Diagnosis Date   Medical history non-contributory     Patient Active Problem List   Diagnosis Date Noted   Abnormal feces    Benign neoplasm of cecum    Polyp of sigmoid colon    Vaginal atrophy 01/25/2017   Menopausal disorder 01/25/2017    Past Surgical History:  Procedure Laterality Date   CATARACT EXTRACTION W/PHACO Left 09/07/2017   Procedure: CATARACT EXTRACTION PHACO AND INTRAOCULAR LENS PLACEMENT ([place]) LEFT;  Surgeon: Leandrew Koyanagi, MD;  Location: [place];  Service: Ophthalmology;  Laterality: Left;   COLONOSCOPY WITH PROPOFOL N/A 03/20/2018   Procedure: COLONOSCOPY WITH PROPOFOL;  Surgeon: [name], MD;  Location: [place];  Service: Endoscopy;  Laterality: N/A;   HIP SURGERY     INTRACAPSULAR CATARACT EXTRACTION  2008   POLYPECTOMY  03/20/2018   Procedure: POLYPECTOMY;  Surgeon: [name], MD;   Location: [place];  Service: Endoscopy;;    OB History     Gravida  3   Para  3   Term  3   Preterm      AB      Living  3      SAB      IAB      Ectopic      Multiple      Live Births               Home Medications    Prior to Admission medications   Medication Sig Start Date End Date Taking? Authorizing Provider  acyclovir (ZOVIRAX) 400 MG tablet Take 1 tablet (400 mg total) by mouth 2 (two) times daily. Or as needed for symptoms. 05/09/20  Yes Gae Dry, MD  estrogen, conjugated,-medroxyprogesterone (PREMPRO) 0.3-1.5 MG tablet Take 1 tablet by mouth daily. 05/09/20  Yes Gae Dry, MD    Family History No family history on file.  Social History Social History   Tobacco Use   Smoking status: Never   Smokeless tobacco: Never  Vaping Use   Vaping Use: Never used  Substance Use Topics   Alcohol use: Yes    Alcohol/week: 4.0 standard drinks    Types: 4 Cans of beer per week   Drug use: No     Allergies   Keflex [cephalexin], Nitrofuran  derivatives, and Sulfa antibiotics   Review of Systems Review of Systems  Musculoskeletal:  Negative for arthralgias, gait problem and joint swelling.  Skin:  Positive for wound. Negative for color change.  Neurological:  Negative for weakness and numbness.    Physical Exam Triage Vital Signs ED Triage Vitals  Enc Vitals Group     BP 10/30/20 1118 (!) 159/112     Pulse Rate 10/30/20 1118 100     Resp 10/30/20 1118 18     Temp 10/30/20 1118 98.3 F (36.8 C)     Temp Source 10/30/20 1118 Oral     SpO2 10/30/20 1118 100 %     Weight 10/30/20 1119 132 lb 0.9 oz (59.9 kg)     Height 10/30/20 1119 '5\' 4"'$  (1.626 m)     Head Circumference --      Peak Flow --      Pain Score 10/30/20 1119 0     Pain Loc --      Pain Edu? --      Excl. in [place]? --    No data found.  Updated Vital Signs BP (!) 159/112 (BP Location: Left Arm)   Pulse 100   Temp 98.3 F (36.8 C) (Oral)   Resp 18    Ht '5\' 4"'$  (1.626 m)   Wt 132 lb 0.9 oz (59.9 kg)   SpO2 100%   BMI 22.67 kg/m      Physical Exam Vitals and nursing note reviewed.  Constitutional:      General: She is not in acute distress.    Appearance: Normal appearance. She is not ill-appearing or toxic-appearing.  HENT:     Head: Normocephalic and atraumatic.  Eyes:     General: No scleral icterus.       Right eye: No discharge.        Left eye: No discharge.     Conjunctiva/sclera: Conjunctivae normal.  Cardiovascular:     Rate and Rhythm: Normal rate and regular rhythm.  Pulmonary:     Effort: Pulmonary effort is normal. No respiratory distress.  Musculoskeletal:     Cervical back: Neck supple.  Skin:    General: Skin is dry.     Comments: LEFT ANKLE: There is a small puncture wound of the left posterior ankle. Area appear superficial, no bleeding or drainage. No swelling, ecchymosis. No TTP.  Neurological:     General: No focal deficit present.     Mental Status: She is alert. Mental status is at baseline.     Motor: No weakness.     Gait: Gait normal.  Psychiatric:        Mood and Affect: Mood normal.        Behavior: Behavior normal.        Thought Content: Thought content normal.     UC Treatments / Results  Labs (all labs ordered are listed, but only abnormal results are displayed) Labs Reviewed - No data to display  EKG   Radiology No results found.  Procedures Procedures (including critical care time)  Medications Ordered in UC Medications  Tdap (BOOSTRIX) injection 0.5 mL (has no administration in time range)    Initial Impression / Assessment and Plan / UC Course  I have reviewed the triage vital signs and the nursing notes.  Pertinent labs & imaging results that were available during my care of the patient were reviewed by me and considered in my medical decision making (see chart for details).  75 year old female presenting  for puncture wound to the left ankle that she sustained a  couple of days ago after she was poked by a rusty part of her tomato grate.  She requests a tetanus immunization if possible today.  States she was vaccinated for COVID-19 and influenza yesterday but feels fine.  Exam does reveal a small superficial puncture wound to the left posterior ankle.  No tenderness, ecchymosis or signs of infection.  Reviewed wound care guidelines.  Advise follow-up for signs of infection.  Checked up-to-date and no medication interactions between these 3 vaccines.  Patient given Tdap at this time.  Advised on supportive care.  Final Clinical Impressions(s) / UC Diagnoses   Final diagnoses:  Puncture wound of left ankle, initial encounter  Not up to date with tetanus toxoid immunization     Discharge Instructions      -The puncture site is well healing and not infected. Continue to keep clean and follow up with [place] if any signs of infection--redness, swelling, pain, pustular drainage -You have had your tetanus updated today. Should be good for 10 years. You may feel some soreness. Continue with Tylenol and icing the arm.      ED Prescriptions   None    PDMP not reviewed this encounter.   Danton Clap, PA-C 10/30/20 1218

## 2020-12-19 DIAGNOSIS — H40003 Preglaucoma, unspecified, bilateral: Secondary | ICD-10-CM | POA: Diagnosis not present

## 2021-05-08 ENCOUNTER — Telehealth: Payer: Self-pay

## 2021-05-08 NOTE — Telephone Encounter (Signed)
Pt calling for refill of her prempro and acyclovir.  [phone] ?

## 2021-05-11 NOTE — Telephone Encounter (Signed)
I contacted patient to schedule an appointment. Patient is requesting prescriptions to be sent every 3 month for year Mail order [facility]. Patient reports she has medicare A+B and medicare only covers every two years for annual. Please advise

## 2021-05-12 ENCOUNTER — Other Ambulatory Visit: Payer: Self-pay | Admitting: Obstetrics & Gynecology

## 2021-05-12 DIAGNOSIS — Z8619 Personal history of other infectious and parasitic diseases: Secondary | ICD-10-CM

## 2021-05-12 DIAGNOSIS — N952 Postmenopausal atrophic vaginitis: Secondary | ICD-10-CM

## 2021-05-12 MED ORDER — PREMPRO 0.3-1.5 MG PO TABS: 1.0000 | ORAL_TABLET | Freq: Every day | ORAL | 1 refills | Status: DC

## 2021-05-12 MED ORDER — ACYCLOVIR 400 MG PO TABS: 400.0000 mg | ORAL_TABLET | Freq: Two times a day (BID) | ORAL | 1 refills | Status: DC

## 2021-05-12 NOTE — Telephone Encounter (Addendum)
Pt is requesting a year's supply to get her to her annual when she will see someone new - that is her first choice; 2nd choice is virtual appt c you to get a year's worth of refills; 3rd choice is office visit in 58mfor vag dryness and m'care charged for just an office visit.  Pt really thinks it's a waste of time for provider and her to come in now.  Pt states she is not having any bleeding or dryness.   ? ?*Pt called back stating she had counted her pills and the 637ms okay.  She will have enough to get her to the end of the year as she only takes them Mon-Fri and not on the weekends. ?

## 2021-05-12 NOTE — Telephone Encounter (Signed)
Refill sent.  She will need to be seen by a new provider, not necessarily for an "Annual" but for continued care for hormone related condition (continued need for hormone medicine therapy)

## 2021-05-14 IMAGING — MG MM DIGITAL SCREENING BILAT W/ TOMO AND CAD
8 series · 8 of 24 positions shown · non-contrast
Comparison: Previous exam(s).

CLINICAL DATA: Screening.

EXAM:
DIGITAL SCREENING BILATERAL MAMMOGRAM WITH TOMOSYNTHESIS AND CAD
TECHNIQUE: Bilateral screening digital craniocaudal and mediolateral oblique
mammograms were obtained. Bilateral screening digital breast
tomosynthesis was performed. The images were evaluated with
computer-aided detection.

[R MLO synth-2D]
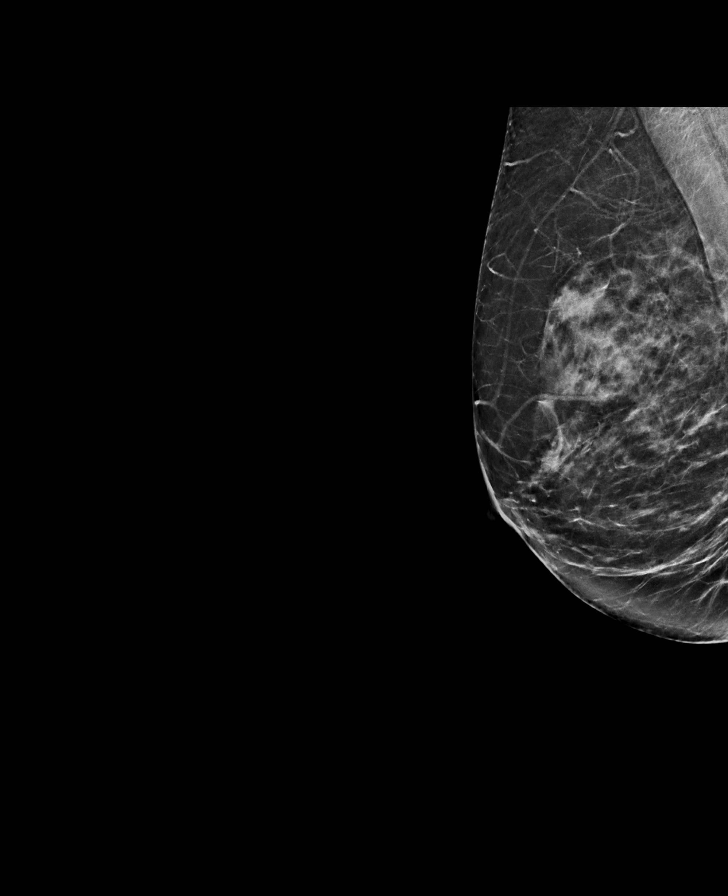

[R CC synth-2D]
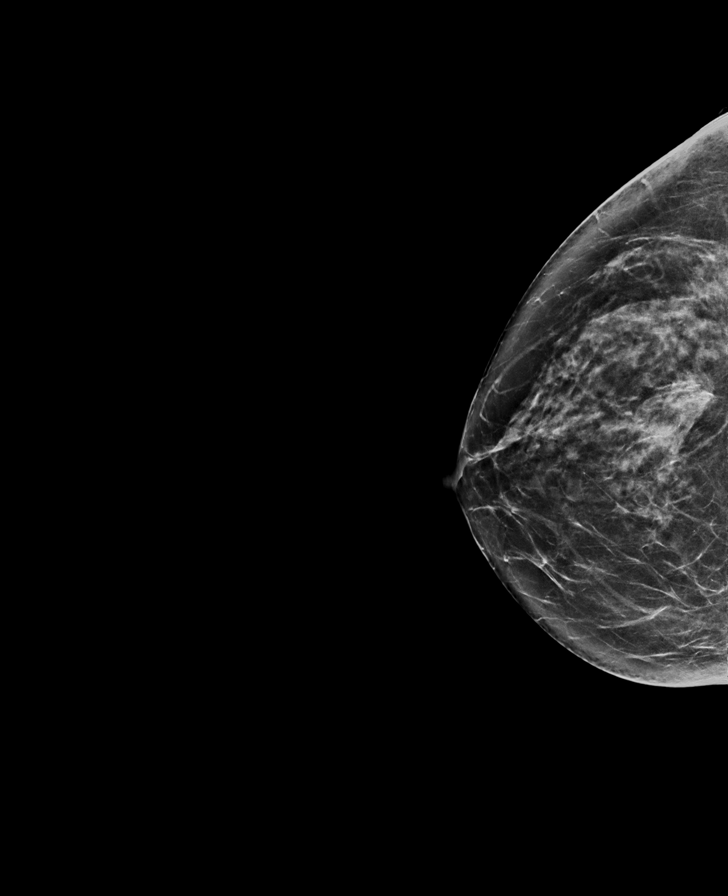

[L CC synth-2D]
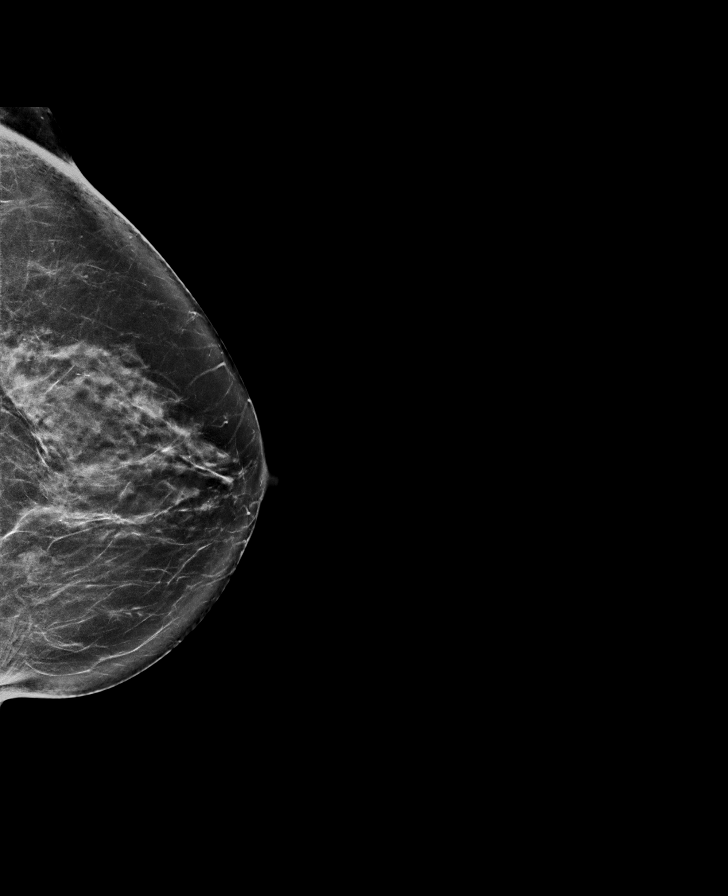

[L MLO synth-2D]
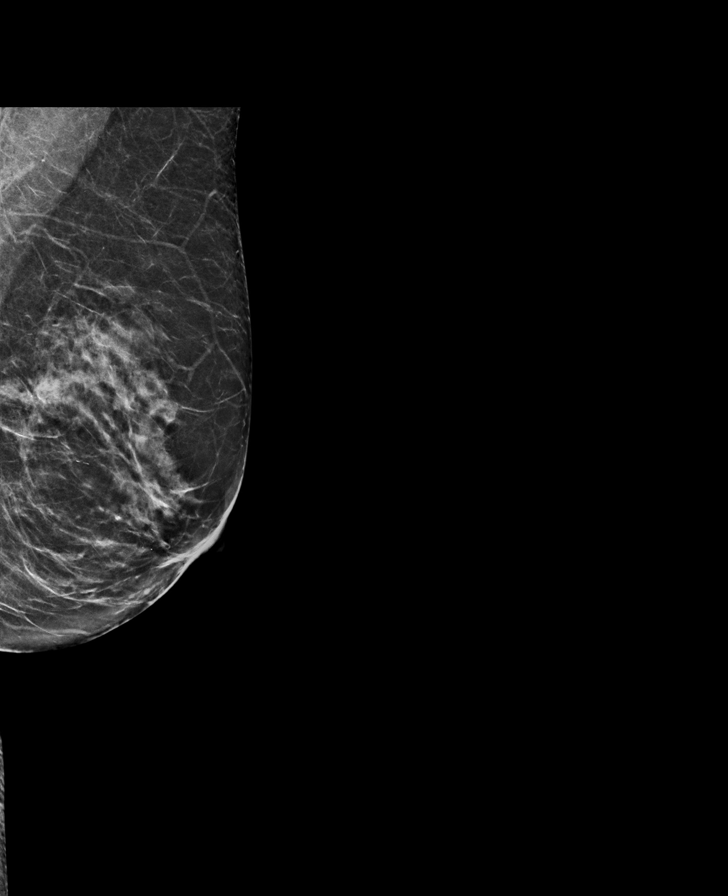

[L MLO tomo · tomo slice 33/66.0]
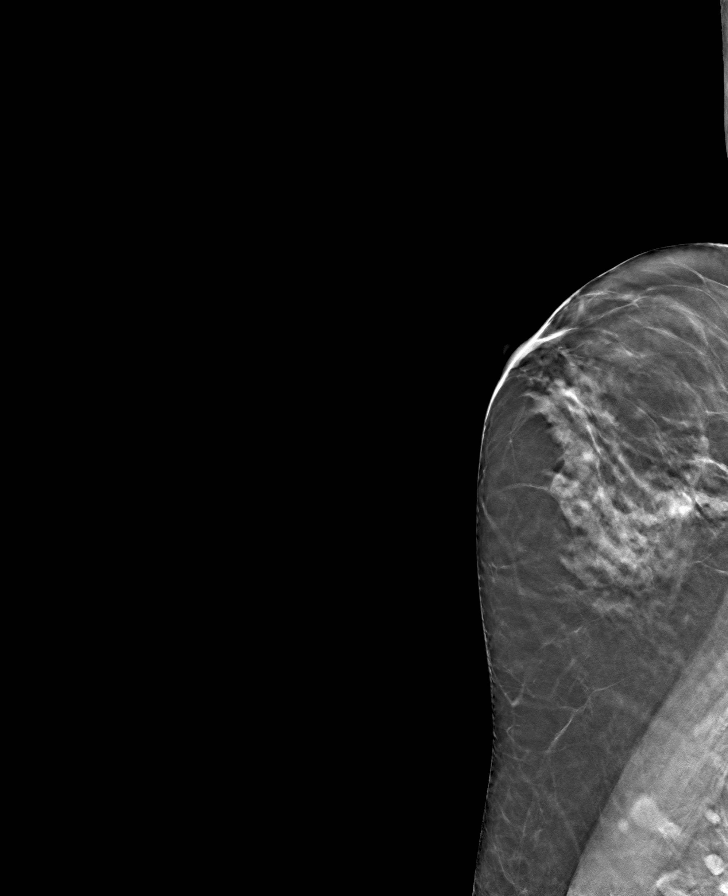

[L CC tomo · tomo slice 37/72.0]
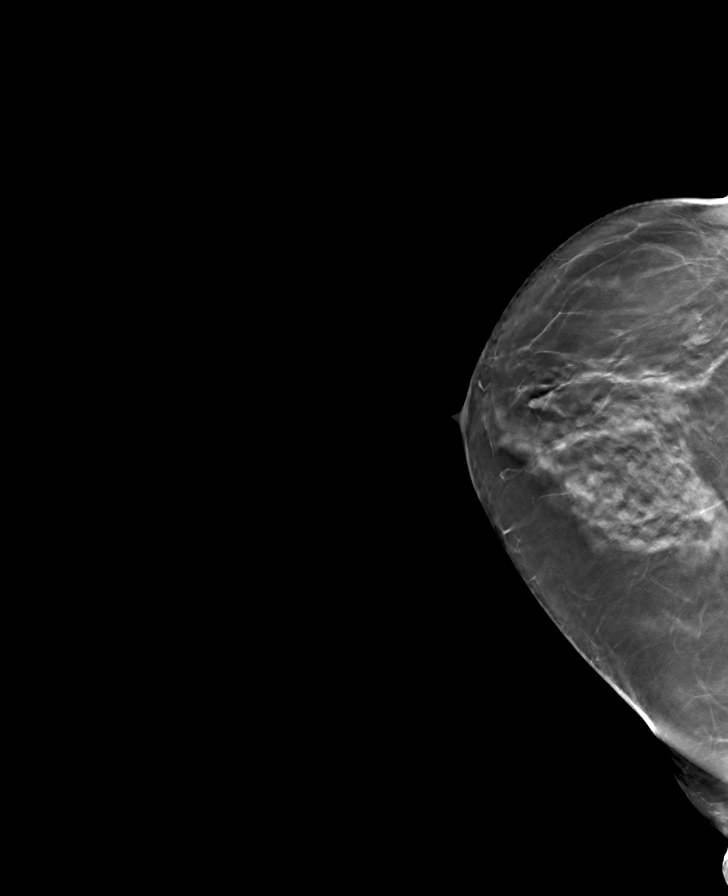

[R CC tomo · tomo slice 35/69.0]
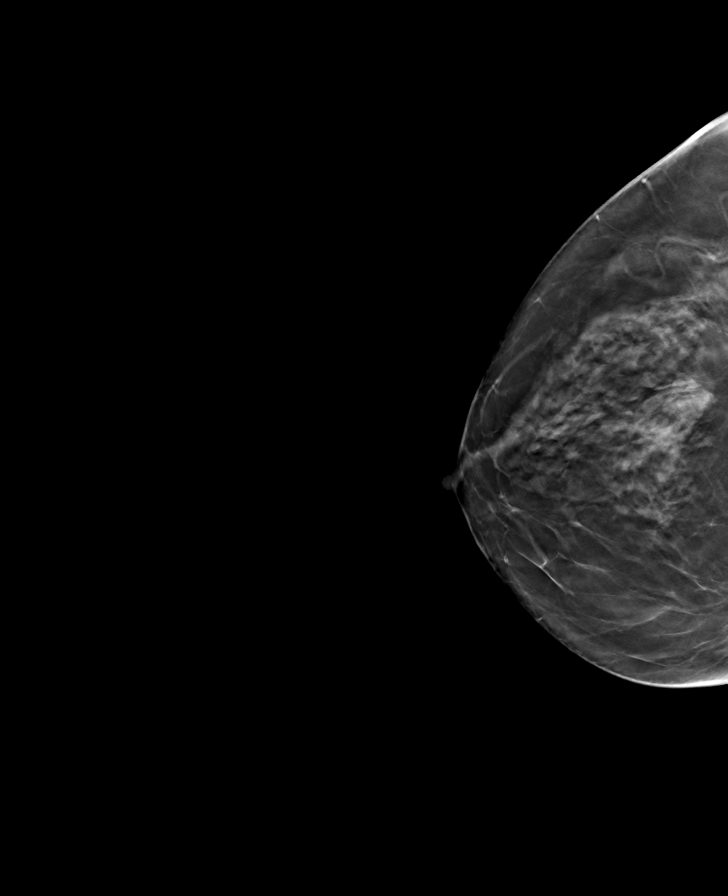

[R MLO tomo · tomo slice 33/66.0]
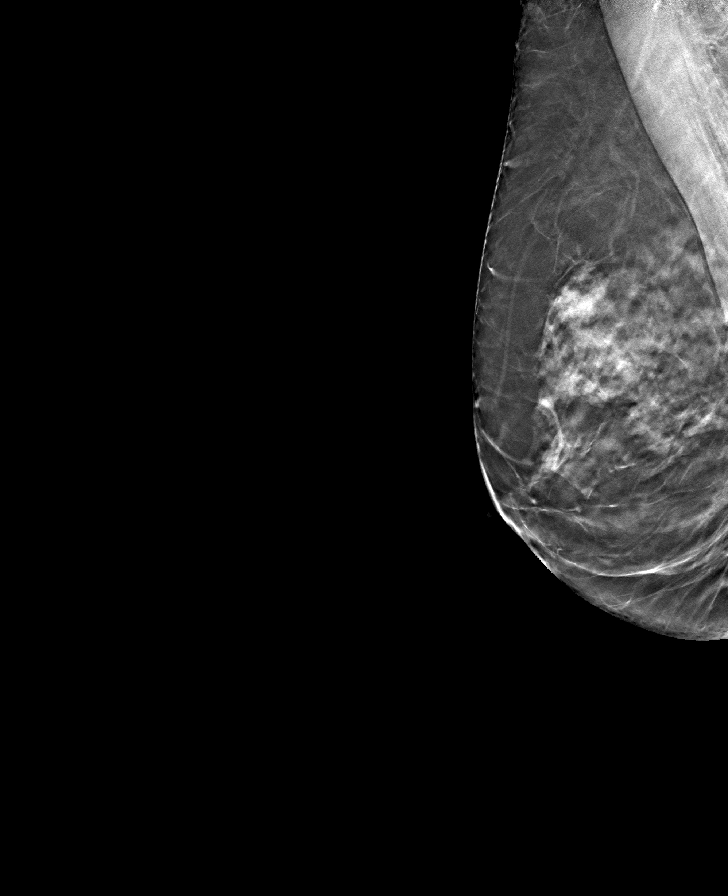

[8 of 24 positions shown; findings below may reference images not displayed]

ACR Breast Density Category c: The breast tissue is heterogeneously
dense, which may obscure small masses.
FINDINGS: There are no findings suspicious for malignancy. The images were
evaluated with computer-aided detection.
IMPRESSION: No mammographic evidence of malignancy. A result letter of this
screening mammogram will be mailed directly to the patient.

RECOMMENDATION:
Screening mammogram in one year. (Code:T4-5-GWO)

BI-RADS CATEGORY  1: Negative.

## 2021-05-14 NOTE — Telephone Encounter (Signed)
Recall letter added to patient's chart. We have added her to the call list to contacted her closer to time

## 2021-06-18 DIAGNOSIS — H40003 Preglaucoma, unspecified, bilateral: Secondary | ICD-10-CM | POA: Diagnosis not present

## 2021-12-07 DIAGNOSIS — Z23 Encounter for immunization: Secondary | ICD-10-CM | POA: Diagnosis not present

## 2021-12-15 DIAGNOSIS — H40003 Preglaucoma, unspecified, bilateral: Secondary | ICD-10-CM | POA: Diagnosis not present

## 2021-12-15 DIAGNOSIS — H43813 Vitreous degeneration, bilateral: Secondary | ICD-10-CM | POA: Diagnosis not present

## 2021-12-15 DIAGNOSIS — Z961 Presence of intraocular lens: Secondary | ICD-10-CM | POA: Diagnosis not present

## 2022-04-06 ENCOUNTER — Telehealth: Payer: Self-pay

## 2022-04-06 NOTE — Telephone Encounter (Signed)
Patient is scheduled for 3/19 at 1:55 pm with ABC

## 2022-04-06 NOTE — Telephone Encounter (Signed)
Pt has been trying to two days to schedule an appointment.  Please call [phone]

## 2022-05-10 NOTE — Progress Notes (Unsigned)
PCP: Gae Dry, MD   No chief complaint on file.   HPI:      [name] is a 77 y.o. G3P3003 whose LMP was No LMP recorded. Patient is postmenopausal., presents today for her annual examination.  Her menses are {norm/abn:715}, lasting {number: 22536} days.  Dysmenorrhea {dysmen:716}. She {does:18564} have intermenstrual bleeding. She {does:18564} have vasomotor sx. On prempro for HRT  Sex activity: {sex active: 315163}. She {does:18564} have vaginal dryness.  Last Pap: [id number]  Results were: {norm/abn:16707::"no abnormalities"} /neg HPV DNA.  Hx of STDs: {STD hx:14358}  Last mammogram: {date:304500300}  Results were: {norm/abn:13465} There is no FH of breast cancer. There is no FH of ovarian cancer. The patient {does:18564} do self-breast exams.  Colonoscopy: {hx:15363}  Repeat due after 10*** years. 1/20 with polyp repeat due ?? / pos cologard 1/20  Tobacco use: {tob:20664} Alcohol use: {Alcohol:11675} No drug use Exercise: {exercise:31265}  She {does:18564} get adequate calcium and Vitamin D in her diet.  Labs with PCP. 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  Patient Active Problem List   Diagnosis Date Noted   Abnormal feces    Benign neoplasm of cecum    Polyp of sigmoid colon    Vaginal atrophy 01/25/2017   Menopausal disorder 01/25/2017    Past Surgical History:  Procedure Laterality Date   CATARACT EXTRACTION W/PHACO Left 09/07/2017   Procedure: CATARACT EXTRACTION PHACO AND INTRAOCULAR LENS PLACEMENT ([place]) LEFT;  Surgeon: Leandrew Koyanagi, MD;  Location: [place];  Service: Ophthalmology;  Laterality: Left;   COLONOSCOPY WITH PROPOFOL N/A 03/20/2018   Procedure: COLONOSCOPY WITH PROPOFOL;  Surgeon: [name], MD;  Location: [place];  Service: Endoscopy;  Laterality: N/A;   HIP SURGERY     INTRACAPSULAR CATARACT EXTRACTION  2008   POLYPECTOMY  03/20/2018   Procedure: POLYPECTOMY;  Surgeon: [name], MD;   Location: [place];  Service: Endoscopy;;    No family history on file.  Social History   Socioeconomic History   Marital status: Single    Spouse name: Not on file   Number of children: Not on file   Years of education: Not on file   Highest education level: Not on file  Occupational History   Not on file  Tobacco Use   Smoking status: Never   Smokeless tobacco: Never  Vaping Use   Vaping Use: Never used  Substance and Sexual Activity   Alcohol use: Yes    Alcohol/week: 4.0 standard drinks of alcohol    Types: 4 Cans of beer per week   Drug use: No   Sexual activity: Yes  Other Topics Concern   Not on file  Social History Narrative   Not on file   Social Determinants of Health   Financial Resource Strain: Not on file  Food Insecurity: Not on file  Transportation Needs: Not on file  Physical Activity: Not on file  Stress: Not on file  Social Connections: Not on file  Intimate Partner Violence: Not on file     Current Outpatient Medications:    acyclovir (ZOVIRAX) 400 MG tablet, Take 1 tablet (400 mg total) by mouth 2 (two) times daily. Or as needed for symptoms., Disp: 60 tablet, Rfl: 1   estrogen, conjugated,-medroxyprogesterone (PREMPRO) 0.3-1.5 MG tablet, Take 1 tablet by mouth daily., Disp: 90 tablet, Rfl: 1     ROS:  Review of Systems BREAST: No symptoms    Objective: There were no vitals taken for this visit.  OBGyn Exam  Results: No results found for this or any previous visit (from the past 24 hour(s)).  Assessment/Plan:  No diagnosis found.   No orders of the defined types were placed in this encounter.           GYN counsel {counseling: 16159}    F/U  No follow-ups on file.  Flornce Record B. Thurmond Hildebran, PA-C 05/10/2022 5:24 PM

## 2022-05-11 ENCOUNTER — Encounter: Payer: Self-pay | Admitting: Obstetrics and Gynecology

## 2022-05-11 ENCOUNTER — Ambulatory Visit (INDEPENDENT_AMBULATORY_CARE_PROVIDER_SITE_OTHER): Payer: Medicare Other | Admitting: Obstetrics and Gynecology

## 2022-05-11 VITALS — BP 140/90 | Ht 64.0 in | Wt 120.5 lb

## 2022-05-11 DIAGNOSIS — Z13820 Encounter for screening for osteoporosis: Secondary | ICD-10-CM

## 2022-05-11 DIAGNOSIS — Z79890 Hormone replacement therapy (postmenopausal): Secondary | ICD-10-CM

## 2022-05-11 DIAGNOSIS — Z8619 Personal history of other infectious and parasitic diseases: Secondary | ICD-10-CM

## 2022-05-11 DIAGNOSIS — Z1231 Encounter for screening mammogram for malignant neoplasm of breast: Secondary | ICD-10-CM

## 2022-05-11 DIAGNOSIS — E2839 Other primary ovarian failure: Secondary | ICD-10-CM

## 2022-05-11 DIAGNOSIS — Z1211 Encounter for screening for malignant neoplasm of colon: Secondary | ICD-10-CM

## 2022-05-11 DIAGNOSIS — N952 Postmenopausal atrophic vaginitis: Secondary | ICD-10-CM

## 2022-05-11 DIAGNOSIS — Z01419 Encounter for gynecological examination (general) (routine) without abnormal findings: Secondary | ICD-10-CM | POA: Diagnosis not present

## 2022-05-11 MED ORDER — ACYCLOVIR 400 MG PO TABS: 400.0000 mg | ORAL_TABLET | Freq: Two times a day (BID) | ORAL | 1 refills | Status: DC

## 2022-05-11 MED ORDER — PREMPRO 0.3-1.5 MG PO TABS: 1.0000 | ORAL_TABLET | Freq: Every day | ORAL | 3 refills | Status: DC

## 2022-05-11 NOTE — Patient Instructions (Addendum)
I value your feedback and you entrusting us with your care. If you get a [facility] [place] patient survey, I would appreciate you taking the time to let us know about your experience today. Thank you!  [facility]: [phone]      

## 2022-05-19 ENCOUNTER — Ambulatory Visit
Admission: RE | Admit: 2022-05-19 | Discharge: 2022-05-19 | Disposition: A | Discharge status: Home / Self Care | Payer: Medicare Other | Source: Ambulatory Visit | Attending: Obstetrics and Gynecology | Admitting: Obstetrics and Gynecology

## 2022-05-19 DIAGNOSIS — Z13820 Encounter for screening for osteoporosis: Secondary | ICD-10-CM | POA: Diagnosis not present

## 2022-05-19 DIAGNOSIS — E2839 Other primary ovarian failure: Secondary | ICD-10-CM | POA: Insufficient documentation

## 2022-05-19 DIAGNOSIS — Z1231 Encounter for screening mammogram for malignant neoplasm of breast: Secondary | ICD-10-CM

## 2022-05-19 DIAGNOSIS — M85832 Other specified disorders of bone density and structure, left forearm: Secondary | ICD-10-CM | POA: Diagnosis not present

## 2022-05-19 DIAGNOSIS — Z780 Asymptomatic menopausal state: Secondary | ICD-10-CM | POA: Diagnosis not present

## 2022-05-20 ENCOUNTER — Encounter: Payer: Self-pay | Admitting: Obstetrics and Gynecology

## 2022-06-14 ENCOUNTER — Encounter: Payer: Self-pay | Admitting: Obstetrics and Gynecology

## 2022-06-17 DIAGNOSIS — Z961 Presence of intraocular lens: Secondary | ICD-10-CM | POA: Diagnosis not present

## 2022-06-17 DIAGNOSIS — H40003 Preglaucoma, unspecified, bilateral: Secondary | ICD-10-CM | POA: Diagnosis not present

## 2022-06-21 NOTE — Telephone Encounter (Signed)
Pls come see me about this

## 2022-06-21 NOTE — Telephone Encounter (Signed)
Spoke with Harriett Sine. I corrected claim for the medicare annual. Harriett Sine to let someone know to refile.

## 2022-09-06 DIAGNOSIS — M25551 Pain in right hip: Secondary | ICD-10-CM | POA: Diagnosis not present

## 2022-11-01 DIAGNOSIS — Z23 Encounter for immunization: Secondary | ICD-10-CM | POA: Diagnosis not present

## 2022-12-20 DIAGNOSIS — H40003 Preglaucoma, unspecified, bilateral: Secondary | ICD-10-CM | POA: Diagnosis not present

## 2022-12-20 DIAGNOSIS — H43813 Vitreous degeneration, bilateral: Secondary | ICD-10-CM | POA: Diagnosis not present

## 2022-12-20 DIAGNOSIS — Z961 Presence of intraocular lens: Secondary | ICD-10-CM | POA: Diagnosis not present

## 2023-02-03 NOTE — Telephone Encounter (Signed)
I sent message to Feliciana Rossetti in billing 12/30/22 re: adjusting claim. She sent message back today that claim was changed for Medicare pelvic/breast. I notified pt. She is to f/u if receives any more bills.

## 2023-06-21 DIAGNOSIS — Z961 Presence of intraocular lens: Secondary | ICD-10-CM | POA: Diagnosis not present

## 2023-06-21 DIAGNOSIS — H40003 Preglaucoma, unspecified, bilateral: Secondary | ICD-10-CM | POA: Diagnosis not present

## 2023-06-21 DIAGNOSIS — H43813 Vitreous degeneration, bilateral: Secondary | ICD-10-CM | POA: Diagnosis not present

## 2023-06-23 NOTE — Progress Notes (Unsigned)
 Alben Alma, MD   No chief complaint on file.   HPI:      [name] is a 78 y.o. G3P3003 whose LMP was No LMP recorded. Patient is postmenopausal., presents today for ***  On prempro  0.3-1.5 vaginal dryness sx/osteoporosis prevention (not had a DEXA in over 10 yrs per pt report). Takes about 5 times a wk. Tried to do less frequently and has increased vag dryness/dyspareunia sx. Pt has not done vag ERT. Pt aware of risks including DVTs/CVA/breast cancer but wants to continue HRT for now. Will consider vag ERT instead.  Vaginal atrophy - Plan: estrogen, conjugated,-medroxyprogesterone (PREMPRO ) 0.3-1.5 MG tablet; Rx RF to mail order.  Discussed estrace vs imvexxy vs intrarosa instead of oral Rx. Pt to consider.    Hormone replacement therapy (HRT) - Plan: estrogen, conjugated,-medroxyprogesterone (PREMPRO ) 0.3-1.5 MG tablet; Pt aware of risks>benefits at her age and wants to continue oral HRT for now. Rx RF to mail order. Since pt is happy with osteoporosis benefit of HRT, will check DEXA since not done in about 10 yrs. Will f/u with results.   Patient Active Problem List   Diagnosis Date Noted   Abnormal feces    Benign neoplasm of cecum    Polyp of sigmoid colon    Vaginal atrophy 01/25/2017   Menopausal disorder 01/25/2017    Past Surgical History:  Procedure Laterality Date   CATARACT EXTRACTION W/PHACO Left 09/07/2017   Procedure: CATARACT EXTRACTION PHACO AND INTRAOCULAR LENS PLACEMENT ([place]) LEFT;  Surgeon: Annell Kidney, MD;  [facility];  Service: Ophthalmology;  Laterality: Left;   COLONOSCOPY WITH PROPOFOL  N/A 03/20/2018   Procedure: COLONOSCOPY WITH PROPOFOL ;  Surgeon: Marnee Sink, MD;  Location: [facility];  Service: Endoscopy;  Laterality: N/A;   HIP SURGERY  08/2019   INTRACAPSULAR CATARACT EXTRACTION  2008   POLYPECTOMY  03/20/2018   Procedure: POLYPECTOMY;  Surgeon: Marnee Sink, MD;  Location: [facility];   Service: Endoscopy;;    No family history on file.  Social History   Socioeconomic History   Marital status: Single    Spouse name: Not on file   Number of children: Not on file   Years of education: Not on file   Highest education level: Not on file  Occupational History   Not on file  Tobacco Use   Smoking status: Never   Smokeless tobacco: Never  Vaping Use   Vaping status: Never Used  Substance and Sexual Activity   Alcohol use: Yes    Alcohol/week: 4.0 standard drinks of alcohol    Types: 4 Cans of beer per week   Drug use: No   Sexual activity: Yes    Birth control/protection: Post-menopausal  Other Topics Concern   Not on file  Social History Narrative   Not on file   Social Drivers of Health   Financial Resource Strain: Not on file  Food Insecurity: Not on file  Transportation Needs: Not on file  Physical Activity: Not on file  Stress: Not on file  Social Connections: Not on file  Intimate Partner Violence: Not on file    Outpatient Medications Prior to Visit  Medication Sig Dispense Refill   acyclovir  (ZOVIRAX ) 400 MG tablet Take 1 tablet (400 mg total) by mouth 2 (two) times daily. Or as needed for symptoms. 60 tablet 1   estrogen, conjugated,-medroxyprogesterone (PREMPRO ) 0.3-1.5 MG tablet Take 1 tablet by mouth daily. 90 tablet 3   No facility-administered medications prior to  visit.      ROS:  Review of Systems BREAST: No symptoms   OBJECTIVE:   Vitals:  There were no vitals taken for this visit.  Physical Exam  Results: No results found for this or any previous visit (from the past 24 hours).   Assessment/Plan: No diagnosis found.    No orders of the defined types were placed in this encounter.     No follow-ups on file.  Annalisa Colonna B. Viral Schramm, PA-C 06/23/2023 4:06 PM

## 2023-06-28 ENCOUNTER — Ambulatory Visit (INDEPENDENT_AMBULATORY_CARE_PROVIDER_SITE_OTHER): Admitting: Obstetrics and Gynecology

## 2023-06-28 ENCOUNTER — Encounter: Payer: Self-pay | Admitting: Obstetrics and Gynecology

## 2023-06-28 VITALS — BP 141/72 | HR 97 | Ht 64.0 in | Wt 117.0 lb

## 2023-06-28 DIAGNOSIS — Z8619 Personal history of other infectious and parasitic diseases: Secondary | ICD-10-CM

## 2023-06-28 DIAGNOSIS — N952 Postmenopausal atrophic vaginitis: Secondary | ICD-10-CM

## 2023-06-28 DIAGNOSIS — Z79890 Hormone replacement therapy (postmenopausal): Secondary | ICD-10-CM

## 2023-06-28 DIAGNOSIS — Z1231 Encounter for screening mammogram for malignant neoplasm of breast: Secondary | ICD-10-CM

## 2023-06-28 MED ORDER — ACYCLOVIR 400 MG PO TABS: 400.0000 mg | ORAL_TABLET | Freq: Two times a day (BID) | ORAL | 1 refills | Status: AC

## 2023-06-28 MED ORDER — PREMPRO 0.3-1.5 MG PO TABS: 1.0000 | ORAL_TABLET | Freq: Every day | ORAL | 3 refills | Status: AC

## 2023-06-28 NOTE — Patient Instructions (Addendum)
 I value your feedback and you entrusting [place] with your care. If you get a [facility] patient survey, I would appreciate you taking the time to let us know about your experience today. Thank you!  [facility] ([place])--[phone]

## 2023-11-24 DIAGNOSIS — Z23 Encounter for immunization: Secondary | ICD-10-CM | POA: Diagnosis not present
